# Patient Record
Sex: Female | Born: 1960
Health system: Southern US, Community
[De-identification: ages and names within clinical notes are randomized; demographics above are authoritative.]

## PROBLEM LIST (undated history)

## (undated) DIAGNOSIS — R1031 Right lower quadrant pain: Secondary | ICD-10-CM

## (undated) DIAGNOSIS — I10 Essential (primary) hypertension: Secondary | ICD-10-CM

---

## 1998-04-15 ENCOUNTER — Other Ambulatory Visit: Admission: RE | Admit: 1998-04-15 | Discharge: 1998-04-15 | Payer: Self-pay | Admitting: Obstetrics and Gynecology

## 2001-02-05 ENCOUNTER — Emergency Department (HOSPITAL_COMMUNITY): Admission: EM | Admit: 2001-02-05 | Discharge: 2001-02-05 | Payer: Self-pay | Admitting: Emergency Medicine

## 2001-07-04 ENCOUNTER — Other Ambulatory Visit: Admission: RE | Admit: 2001-07-04 | Discharge: 2001-07-04 | Payer: Self-pay | Admitting: Obstetrics and Gynecology

## 2002-12-24 ENCOUNTER — Emergency Department (HOSPITAL_COMMUNITY): Admission: EM | Admit: 2002-12-24 | Discharge: 2002-12-25 | Payer: Self-pay | Admitting: Emergency Medicine

## 2002-12-25 ENCOUNTER — Encounter: Payer: Self-pay | Admitting: Emergency Medicine

## 2002-12-25 ENCOUNTER — Other Ambulatory Visit: Admission: RE | Admit: 2002-12-25 | Discharge: 2002-12-25 | Payer: Self-pay | Admitting: Obstetrics and Gynecology

## 2015-03-22 ENCOUNTER — Ambulatory Visit (HOSPITAL_COMMUNITY)
Admission: RE | Admit: 2015-03-22 | Discharge: 2015-03-22 | Disposition: A | Discharge status: Home / Self Care | Payer: Self-pay | Source: Ambulatory Visit | Attending: General Practice | Admitting: General Practice

## 2015-03-22 ENCOUNTER — Other Ambulatory Visit (HOSPITAL_COMMUNITY): Payer: Self-pay | Admitting: General Practice

## 2015-03-22 DIAGNOSIS — R7612 Nonspecific reaction to cell mediated immunity measurement of gamma interferon antigen response without active tuberculosis: Secondary | ICD-10-CM

## 2016-01-17 ENCOUNTER — Inpatient Hospital Stay (HOSPITAL_COMMUNITY): Payer: 59

## 2016-01-17 ENCOUNTER — Encounter (HOSPITAL_COMMUNITY): Payer: Self-pay | Admitting: Emergency Medicine

## 2016-01-17 ENCOUNTER — Inpatient Hospital Stay (HOSPITAL_COMMUNITY)
Admission: EM | Admit: 2016-01-17 | Discharge: 2016-01-20 | DRG: 757 | Disposition: A | Discharge status: Home / Self Care | Payer: 59 | Attending: General Surgery | Admitting: General Surgery

## 2016-01-17 ENCOUNTER — Emergency Department (HOSPITAL_COMMUNITY): Payer: 59

## 2016-01-17 DIAGNOSIS — Y838 Other surgical procedures as the cause of abnormal reaction of the patient, or of later complication, without mention of misadventure at the time of the procedure: Secondary | ICD-10-CM | POA: Diagnosis not present

## 2016-01-17 DIAGNOSIS — I10 Essential (primary) hypertension: Secondary | ICD-10-CM | POA: Diagnosis present

## 2016-01-17 DIAGNOSIS — K352 Acute appendicitis with generalized peritonitis: Secondary | ICD-10-CM | POA: Diagnosis present

## 2016-01-17 DIAGNOSIS — F17210 Nicotine dependence, cigarettes, uncomplicated: Secondary | ICD-10-CM | POA: Diagnosis present

## 2016-01-17 DIAGNOSIS — T85838A Hemorrhage due to other internal prosthetic devices, implants and grafts, initial encounter: Secondary | ICD-10-CM | POA: Diagnosis not present

## 2016-01-17 DIAGNOSIS — K5900 Constipation, unspecified: Secondary | ICD-10-CM | POA: Diagnosis not present

## 2016-01-17 DIAGNOSIS — R1084 Generalized abdominal pain: Secondary | ICD-10-CM | POA: Diagnosis not present

## 2016-01-17 DIAGNOSIS — R933 Abnormal findings on diagnostic imaging of other parts of digestive tract: Secondary | ICD-10-CM | POA: Diagnosis not present

## 2016-01-17 DIAGNOSIS — R1031 Right lower quadrant pain: Secondary | ICD-10-CM | POA: Diagnosis not present

## 2016-01-17 DIAGNOSIS — N7093 Salpingitis and oophoritis, unspecified: Secondary | ICD-10-CM | POA: Diagnosis not present

## 2016-01-17 DIAGNOSIS — R1900 Intra-abdominal and pelvic swelling, mass and lump, unspecified site: Secondary | ICD-10-CM | POA: Diagnosis not present

## 2016-01-17 DIAGNOSIS — R1030 Lower abdominal pain, unspecified: Secondary | ICD-10-CM | POA: Diagnosis present

## 2016-01-17 DIAGNOSIS — R1903 Right lower quadrant abdominal swelling, mass and lump: Secondary | ICD-10-CM | POA: Diagnosis not present

## 2016-01-17 DIAGNOSIS — R188 Other ascites: Secondary | ICD-10-CM | POA: Diagnosis present

## 2016-01-17 DIAGNOSIS — N134 Hydroureter: Secondary | ICD-10-CM | POA: Diagnosis present

## 2016-01-17 DIAGNOSIS — K651 Peritoneal abscess: Secondary | ICD-10-CM | POA: Diagnosis not present

## 2016-01-17 HISTORY — DX: Essential (primary) hypertension: I10

## 2016-01-17 HISTORY — DX: Right lower quadrant pain: R10.31

## 2016-01-17 LAB — COMPREHENSIVE METABOLIC PANEL
ALBUMIN: 3.3 g/dL — AB (ref 3.5–5.0)
ALT: 56 U/L — ABNORMAL HIGH (ref 14–54)
ANION GAP: 13 (ref 5–15)
AST: 37 U/L (ref 15–41)
Alkaline Phosphatase: 93 U/L (ref 38–126)
BUN: 13 mg/dL (ref 6–20)
CO2: 24 mmol/L (ref 22–32)
Calcium: 9.7 mg/dL (ref 8.9–10.3)
Chloride: 103 mmol/L (ref 101–111)
Creatinine, Ser: 0.83 mg/dL (ref 0.44–1.00)
GFR calc non Af Amer: >60 mL/min (ref >60)
GLUCOSE: 85 mg/dL (ref 65–99)
POTASSIUM: 3.8 mmol/L (ref 3.5–5.1)
SODIUM: 140 mmol/L (ref 135–145)
TOTAL PROTEIN: 7.1 g/dL (ref 6.5–8.1)
Total Bilirubin: 0.4 mg/dL (ref 0.3–1.2)

## 2016-01-17 LAB — CBC
HEMATOCRIT: 33.5 % — AB (ref 36.0–46.0)
HEMOGLOBIN: 11 g/dL — AB (ref 12.0–15.0)
MCH: 31 pg (ref 26.0–34.0)
MCHC: 32.8 g/dL (ref 30.0–36.0)
MCV: 94.4 fL (ref 78.0–100.0)
Platelets: 393 10*3/uL (ref 150–400)
RBC: 3.55 MIL/uL — AB (ref 3.87–5.11)
RDW: 12.3 % (ref 11.5–15.5)
WBC: 20.1 10*3/uL — ABNORMAL HIGH (ref 4.0–10.5)

## 2016-01-17 LAB — URINE MICROSCOPIC-ADD ON

## 2016-01-17 LAB — URINALYSIS, ROUTINE W REFLEX MICROSCOPIC
BILIRUBIN URINE: NEGATIVE
Glucose, UA: NEGATIVE mg/dL
Ketones, ur: NEGATIVE mg/dL
Leukocytes, UA: NEGATIVE
NITRITE: NEGATIVE
PH: 5.5 (ref 5.0–8.0)
Protein, ur: NEGATIVE mg/dL
SPECIFIC GRAVITY, URINE: 1.009 (ref 1.005–1.030)

## 2016-01-17 LAB — APTT: aPTT: 38 seconds — ABNORMAL HIGH (ref 24–37)

## 2016-01-17 LAB — PROTIME-INR
INR: 1.21 (ref 0.00–1.49)
Prothrombin Time: 15.5 seconds — ABNORMAL HIGH (ref 11.6–15.2)

## 2016-01-17 LAB — PREGNANCY, URINE: Preg Test, Ur: NEGATIVE

## 2016-01-17 LAB — I-STAT BETA HCG BLOOD, ED (MC, WL, AP ONLY): HCG, QUANTITATIVE: 22.5 m[IU]/mL — AB (ref <5)

## 2016-01-17 LAB — LIPASE, BLOOD: Lipase: 25 U/L (ref 11–51)

## 2016-01-17 MED ORDER — FENTANYL CITRATE (PF) 100 MCG/2ML IJ SOLN
INTRAMUSCULAR | Status: AC | PRN
  Administered 2016-01-17 (×2): 50 ug via INTRAVENOUS

## 2016-01-17 MED ORDER — ONDANSETRON 4 MG PO TBDP: 4.0000 mg | ORAL_TABLET | Freq: Four times a day (QID) | ORAL | Status: DC | PRN

## 2016-01-17 MED ORDER — MIDAZOLAM HCL 2 MG/2ML IJ SOLN
INTRAMUSCULAR | Status: AC
  Filled 2016-01-17: qty 2

## 2016-01-17 MED ORDER — KETOROLAC TROMETHAMINE 30 MG/ML IJ SOLN
30.0000 mg | Freq: Once | INTRAMUSCULAR | Status: AC
  Administered 2016-01-17: 30 mg via INTRAVENOUS
  Filled 2016-01-17: qty 1

## 2016-01-17 MED ORDER — MIDAZOLAM HCL 2 MG/2ML IJ SOLN
INTRAMUSCULAR | Status: AC | PRN
  Administered 2016-01-17 (×2): 1 mg via INTRAVENOUS

## 2016-01-17 MED ORDER — IOPAMIDOL (ISOVUE-300) INJECTION 61%
INTRAVENOUS | Status: AC
  Filled 2016-01-17: qty 100

## 2016-01-17 MED ORDER — LIDOCAINE-EPINEPHRINE 1 %-1:100000 IJ SOLN
INTRAMUSCULAR | Status: AC
  Filled 2016-01-17: qty 1

## 2016-01-17 MED ORDER — ACETAMINOPHEN 325 MG PO TABS: 650.0000 mg | ORAL_TABLET | Freq: Four times a day (QID) | ORAL | Status: DC | PRN

## 2016-01-17 MED ORDER — IOHEXOL 300 MG/ML  SOLN
100.0000 mL | Freq: Once | INTRAMUSCULAR | Status: AC | PRN
  Administered 2016-01-17: 100 mL via INTRAVENOUS

## 2016-01-17 MED ORDER — FENTANYL CITRATE (PF) 100 MCG/2ML IJ SOLN
INTRAMUSCULAR | Status: AC
  Filled 2016-01-17: qty 2

## 2016-01-17 MED ORDER — DEXTROSE-NACL 5-0.9 % IV SOLN
INTRAVENOUS | Status: DC
  Administered 2016-01-17 – 2016-01-19 (×4): via INTRAVENOUS

## 2016-01-17 MED ORDER — METRONIDAZOLE IN NACL 5-0.79 MG/ML-% IV SOLN
500.0000 mg | Freq: Three times a day (TID) | INTRAVENOUS | Status: DC
  Administered 2016-01-17 – 2016-01-20 (×9): 500 mg via INTRAVENOUS
  Filled 2016-01-17 (×12): qty 100

## 2016-01-17 MED ORDER — MORPHINE SULFATE (PF) 2 MG/ML IV SOLN: 1.0000 mg | INTRAVENOUS | Status: DC | PRN

## 2016-01-17 MED ORDER — ONDANSETRON HCL 4 MG/2ML IJ SOLN: 4.0000 mg | Freq: Four times a day (QID) | INTRAMUSCULAR | Status: DC | PRN

## 2016-01-17 MED ORDER — DEXTROSE 5 % IV SOLN
2.0000 g | INTRAVENOUS | Status: DC
  Administered 2016-01-17 – 2016-01-19 (×3): 2 g via INTRAVENOUS
  Filled 2016-01-17 (×4): qty 2

## 2016-01-17 MED ORDER — ACETAMINOPHEN 650 MG RE SUPP: 650.0000 mg | Freq: Four times a day (QID) | RECTAL | Status: DC | PRN

## 2016-01-17 MED ORDER — SODIUM CHLORIDE 0.9 % IV BOLUS (SEPSIS)
1000.0000 mL | Freq: Once | INTRAVENOUS | Status: AC
  Administered 2016-01-17: 1000 mL via INTRAVENOUS

## 2016-01-17 NOTE — ED Provider Notes (Signed)
CSN: [id number]     Arrival date & time 01/17/16  0112 History   First MD Initiated Contact with Patient 01/17/16 [phone]     Chief Complaint  Patient presents with  . Constipation  . Abdominal Pain   HPI Cheryl Bryan is a 55 y.o. female PMH significant for hypertension presenting with constipation since Saturday. She states she had a thin, soft bowel movement yesterday but "it was like something was blocking it." She endorses mild,diffuse abdominal pain as a sharp pressure and states it was worse a few days ago, for which she was taking ibuprofen. She endorses nausea as well. She denies fevers, chills, emesis, urinary or vaginal complaints.   Past Medical History  Diagnosis Date  . Hypertension    Past Surgical History  Procedure Laterality Date  . Cesarean section     No family history on file. Social History  Substance Use Topics  . Smoking status: Current Every Day Smoker  . Smokeless tobacco: None  . Alcohol Use: Yes   OB History    No data available     Review of Systems  Ten systems are reviewed and are negative for acute change except as noted in the HPI  Allergies  Review of patient's allergies indicates no known allergies.  Home Medications   Prior to Admission medications   Not on File   BP 125/64 mmHg  Pulse 69  Temp(Src) 99.3 F (37.4 C) (Oral)  Resp 16  Ht  (1.676 m)  Wt 81.194 kg  BMI 28.91 kg/m2  SpO2 100% Physical Exam  Constitutional: She appears well-developed and well-nourished. No distress.  HENT:  Head: Normocephalic and atraumatic.  Mouth/Throat: Oropharynx is clear and moist. No oropharyngeal exudate.  Eyes: Conjunctivae are normal. Pupils are equal, round, and reactive to light. Right eye exhibits no discharge. Left eye exhibits no discharge. No scleral icterus.  Neck: No tracheal deviation present.  Cardiovascular: Normal rate, regular rhythm, normal heart sounds and intact distal pulses.  Exam reveals no gallop and no friction  rub.   No murmur heard. Pulmonary/Chest: Effort normal and breath sounds normal. No respiratory distress. She has no wheezes. She has no rales. She exhibits no tenderness.  Abdominal: Soft. Bowel sounds are normal. She exhibits no distension and no mass. There is tenderness. There is no rebound and no guarding.  Distractible, diffuse abdominal tenderness  Musculoskeletal: She exhibits no edema.  Lymphadenopathy:    She has no cervical adenopathy.  Neurological: She is alert. Coordination normal.  Skin: Skin is warm and dry. No rash noted. She is not diaphoretic. No erythema.  Psychiatric: She has a normal mood and affect. Her behavior is normal.  Nursing note and vitals reviewed.   ED Course  Procedures  Labs Review Labs Reviewed  COMPREHENSIVE METABOLIC PANEL - Abnormal; Notable for the following:    Albumin 3.3 (*)    ALT 56 (*)    All other components within normal limits  CBC - Abnormal; Notable for the following:    WBC 20.1 (*)    RBC 3.55 (*)    Hemoglobin 11.0 (*)    HCT 33.5 (*)    All other components within normal limits  URINALYSIS, ROUTINE W REFLEX MICROSCOPIC (NOT AT [facility]) - Abnormal; Notable for the following:    Hgb urine dipstick SMALL (*)    All other components within normal limits  URINE MICROSCOPIC-ADD ON - Abnormal; Notable for the following:    Squamous Epithelial / LPF 0-5 (*)  Bacteria, UA RARE (*)    All other components within normal limits  LIPASE, BLOOD   Imaging Review  Ct Abdomen Pelvis W Contrast  01/17/2016  CLINICAL DATA:  Constipation, leukocytosis, epigastric and right lower quadrant pain EXAM: CT ABDOMEN AND PELVIS WITH CONTRAST TECHNIQUE: Multidetector CT imaging of the abdomen and pelvis was performed using the standard protocol following bolus administration of intravenous contrast. CONTRAST:  100mL OMNIPAQUE IOHEXOL 300 MG/ML  SOLN COMPARISON:  None. FINDINGS: Lower chest:  Clear Hepatobiliary: Normal Pancreas: Normal Spleen: Normal  Adrenals/Urinary Tract: Adrenal glands are normal. Kidneys are normal. Mild dilatation right ureter with caliber transition where it passes along side the right pelvic mass which will be discussed in further detail below Stomach/Bowel: Nonobstructive bowel gas pattern. Stomach small bowel and large bowel are normal. The appendix arises off of the posterior inferior tip of the cecum and extends superiorly and medially. The appendix measures 11 mm in diameter. There is inflammatory change around the appendix, and its medial tip is obscured by its intimate association with the complex right pelvic mass immediately posterior and inferior to the appendix. The mass extends between the appendix in the right adnexa, and demonstrates both solid and cystic components. The cystic components show rim enhancement. This mass measures 51 x 57 mm in transverse, and 75 mm craniocaudal. There is a small volume of free fluid in the right adnexa, with a small loculated fluid component in the inferior posterior right adnexa seen on image number 63, measuring about 14 mm. Inflammatory change extends cranially from the mass into the retroperitoneum immediately inferior to the aortic bifurcation Vascular/Lymphatic: Minimal aortoiliac calcification. Small periaortic and aortocaval retroperitoneal lymph nodes. Reproductive: Left ovary is normal. Right ovary is obscured by the presumably inflammatory mass in the right adnexa. This mass also obscures the anterior and right borders of the uterus. Other: Small fat containing right inguinal hernia Musculoskeletal: No acute osseous abnormalities IMPRESSION: Complex multiloculated cystic/solid mass right lower quadrant. It is inseparable from both the appendix and the right adnexa, and is associated with inflammation as well as mild right hydroureter. Most likely etiology is appendicitis with associated periappendiceal abscesses. Electronically Signed   By: Esperanza Heiraymond  Rubner M.D.   On: 01/17/2016  10:37   I have personally reviewed and evaluated these images and lab results as part of my medical decision-making.  MDM   Final diagnoses:  None   Leukocytosis of 20.1, hemoglobin 11. Positive i-stat hCG at 22.5; negative urine. Due to leukocytosis, will CT scan for further evaluation. CT scan revealed complex multiloculated cystic/solid mass right lower quadrant. It is inseparable from both the appendix and the right adnexa, and is associated with inflammation as well as mild right hydroureter. Most likely etiology is appendicitis with associated periappendiceal abscesses. This case, based on my evaluation, as well as Dr. Garlan FillersKnott's, does not present as a periappendiceal abscess. Patient is afebrile, not tachycardic, not hypotensive, appears well other than mild discomfort. This is concerning for neoplasm. Discussed our concerns with patient, as well as other possible etiologies mentioned in CT scan.  Discussed case with surgery who will admit patient.   Melton KrebsSamantha Nicole Tavarus [place]ote[place], PA-C 01/20/16 1153  Lyndal Pulleyaniel Knott, MD 01/21/16 [phone]

## 2016-01-17 NOTE — Sedation Documentation (Signed)
Patient denies pain and is resting comfortably.  

## 2016-01-17 NOTE — Progress Notes (Signed)
Chief Complaint: Patient was seen in consultation today for pelvic abscess drainage at the request of Dr. Carman Ching  Referring Physician(s): Dr. Carman Ching  Supervising Physician: Simonne Come  History of Present Illness: Cheryl Bryan is a 55 y.o. female with about 5 days of abdominal pain and constipation. She presented to the ER and her workup finds a RLQ abscess possible related to perf appendicitis vs tubovarian abscess vs neoplasm Surgery has evaluated pt and requests IR to perc drain. Chart, PMHx, meds, labs, imaging, allergies reviewed. She has been NPO all day  Past Medical History  Diagnosis Date  . Hypertension     Past Surgical History  Procedure Laterality Date  . Cesarean section      Allergies: Review of patient's allergies indicates no known allergies.  Medications:  Current facility-administered medications:  .  acetaminophen (TYLENOL) tablet 650 mg, 650 mg, Oral, Q6H PRN **OR** acetaminophen (TYLENOL) suppository 650 mg, 650 mg, Rectal, Q6H PRN, Emina Riebock, NP .  cefTRIAXone (ROCEPHIN) 2 g in dextrose 5 % 50 mL IVPB, 2 g, Intravenous, Q24H, Last Rate: 100 mL/hr at 01/17/16 1148, 2 g at 01/17/16 1148 **AND** metroNIDAZOLE (FLAGYL) IVPB 500 mg, 500 mg, Intravenous, Q8H, Emina Riebock, NP .  dextrose 5 %-0.9 % sodium chloride infusion, , Intravenous, Continuous, Emina Riebock, NP .  iopamidol (ISOVUE-300) 61 % injection, , , ,  .  morphine 2 MG/ML injection 1-4 mg, 1-4 mg, Intravenous, Q2H PRN, Emina Riebock, NP .  ondansetron (ZOFRAN-ODT) disintegrating tablet 4 mg, 4 mg, Oral, Q6H PRN **OR** ondansetron (ZOFRAN) injection 4 mg, 4 mg, Intravenous, Q6H PRN, Ashok Norris, NP No current outpatient prescriptions on file.    History reviewed. No pertinent family history.  Social History   Social History  . Marital Status: Married    Spouse Name: N/A  . Number of Children: N/A  . Years of Education: N/A   Social History Main Topics  .  Smoking status: Current Every Day Smoker -- 1.00 packs/day  . Smokeless tobacco: None  . Alcohol Use: Yes     Comment: social  . Drug Use: No  . Sexual Activity: Not Asked   Other Topics Concern  . None   Social History Narrative  . None    ECOG Status: 1 - Symptomatic but completely ambulatory  Review of Systems: A 12 point ROS discussed and pertinent positives are indicated in the HPI above.  All other systems are negative.  Review of Systems  Vital Signs: BP 149/80 mmHg  Pulse 66  Temp(Src) 99.4 F (37.4 C) (Oral)  Resp 18  Ht  (1.676 m)  Wt 179 lb (81.194 kg)  BMI 28.91 kg/m2  SpO2 100%  Physical Exam  Constitutional: She is oriented to person, place, and time. She appears well-developed and well-nourished. No distress.  HENT:  Head: Normocephalic.  Mouth/Throat: Oropharynx is clear and moist.  Neck: Normal range of motion. No JVD present. No tracheal deviation present.  Cardiovascular: Normal rate, regular rhythm and normal heart sounds.   Pulmonary/Chest: Effort normal and breath sounds normal. No respiratory distress.  Abdominal: Soft. She exhibits no distension and no mass. There is tenderness.  Neurological: She is alert and oriented to person, place, and time.  Skin: She is not diaphoretic.  Psychiatric: She has a normal mood and affect. Judgment normal.    Mallampati Score:  MD Evaluation Airway: WNL Heart: WNL Abdomen: WNL Chest/ Lungs: WNL ASA  Classification: 2 Mallampati/Airway Score: One  Imaging:  Ct Abdomen Pelvis W Contrast  01/17/2016  CLINICAL DATA:  Constipation, leukocytosis, epigastric and right lower quadrant pain EXAM: CT ABDOMEN AND PELVIS WITH CONTRAST TECHNIQUE: Multidetector CT imaging of the abdomen and pelvis was performed using the standard protocol following bolus administration of intravenous contrast. CONTRAST:  100mL OMNIPAQUE IOHEXOL 300 MG/ML  SOLN COMPARISON:  None. FINDINGS: Lower chest:  Clear Hepatobiliary:  Normal Pancreas: Normal Spleen: Normal Adrenals/Urinary Tract: Adrenal glands are normal. Kidneys are normal. Mild dilatation right ureter with caliber transition where it passes along side the right pelvic mass which will be discussed in further detail below Stomach/Bowel: Nonobstructive bowel gas pattern. Stomach small bowel and large bowel are normal. The appendix arises off of the posterior inferior tip of the cecum and extends superiorly and medially. The appendix measures 11 mm in diameter. There is inflammatory change around the appendix, and its medial tip is obscured by its intimate association with the complex right pelvic mass immediately posterior and inferior to the appendix. The mass extends between the appendix in the right adnexa, and demonstrates both solid and cystic components. The cystic components show rim enhancement. This mass measures 51 x 57 mm in transverse, and 75 mm craniocaudal. There is a small volume of free fluid in the right adnexa, with a small loculated fluid component in the inferior posterior right adnexa seen on image number 63, measuring about 14 mm. Inflammatory change extends cranially from the mass into the retroperitoneum immediately inferior to the aortic bifurcation Vascular/Lymphatic: Minimal aortoiliac calcification. Small periaortic and aortocaval retroperitoneal lymph nodes. Reproductive: Left ovary is normal. Right ovary is obscured by the presumably inflammatory mass in the right adnexa. This mass also obscures the anterior and right borders of the uterus. Other: Small fat containing right inguinal hernia Musculoskeletal: No acute osseous abnormalities IMPRESSION: Complex multiloculated cystic/solid mass right lower quadrant. It is inseparable from both the appendix and the right adnexa, and is associated with inflammation as well as mild right hydroureter. Most likely etiology is appendicitis with associated periappendiceal abscesses. Electronically Signed   By:  Esperanza Heiraymond  Rubner M.D.   On: 01/17/2016 10:37    Labs:  CBC:  Recent Labs  01/17/16 0204  WBC 20.1*  HGB 11.0*  HCT 33.5*  PLT 393    COAGS:  Recent Labs  01/17/16 1133  INR 1.21  APTT 38*    BMP:  Recent Labs  01/17/16 0204  NA 140  K 3.8  CL 103  CO2 24  GLUCOSE 85  BUN 13  CALCIUM 9.7  CREATININE 0.83  GFRNONAA >60  GFRAA >60    LIVER FUNCTION TESTS:  Recent Labs  01/17/16 0204  BILITOT 0.4  AST 37  ALT 56*  ALKPHOS 93  PROT 7.1  ALBUMIN 3.3*    TUMOR MARKERS: No results for input(s): AFPTM, CEA, CA199, CHROMGRNA in the last 8760 hours.  Assessment and Plan: RLQ/pelvic abscess Plan for CT guided perc drain Labs reviewed Risks and Benefits discussed with the patient including bleeding, infection, damage to adjacent structures, bowel perforation/fistula connection, and sepsis. All of the patient's questions were answered, patient is agreeable to proceed. Consent signed and in chart.    Thank you for this interesting consult.  A copy of this report was sent to the requesting provider on this date.  Electronically Signed: Brayton ElBRUNING, Ellieana Dolecki 01/17/2016, 12:07 PM   I spent a total of 20 minutes in face to face in clinical consultation, greater than 50% of which was counseling/coordinating care for RLQ/pelvic abscess  drainage

## 2016-01-17 NOTE — ED Notes (Signed)
Pt. reports constipation onset last week with intermittent nausea , denies fever or chills , no emesis or diarrhea .

## 2016-01-17 NOTE — Care Management Note (Signed)
Case Management Note  Patient Details  Name: Cheryl Bryan MRN: [id number] Date of Birth: 1960/12/22  Subjective/Objective:                    Action/Plan:  Initial UR completed  Expected Discharge Date:                  Expected Discharge Plan:  Home/Self Care  In-House Referral:     Discharge planning Services     Post Acute Care Choice:    Choice offered to:     DME Arranged:    DME Agency:     HH Arranged:    HH Agency:     Status of Service:  In process, will continue to follow  Medicare Important Message Given:    Date Medicare IM Given:    Medicare IM give by:    Date Additional Medicare IM Given:    Additional Medicare Important Message give by:     If discussed at Long Length of Stay Meetings, dates discussed:    Additional Comments:  [name], [name] [name], RN 01/17/2016, 2:00 PM

## 2016-01-17 NOTE — ED Notes (Signed)
Attempted report x1. 

## 2016-01-17 NOTE — ED Notes (Signed)
All interventions performed by Student RN Michael Peters were with Primary RN Maekayla Giorgio.  

## 2016-01-17 NOTE — H&P (Signed)
Chief Complaint: abdominal pain and constipation  HPI: Cheryl Bryan is a healthy 55 year old female who presents with 6-7 day history of constipation and abdominal pain. Location right lower quadrant radiating to flank and to the periumbilical region.  Denies vomiting.  Endorses to nausea. Denies fever, chills or sweats.  Denies previous symptoms.  Characterized as cramping, sharp pain.  No aggravating factors, alleviated by NSAIDs. Marland Kitchen  Modifying factors include; ibuprofen and  Magnesium citrate.  Denies melena or hematochezia.  Denies menorrhagia.  She has never had a colonoscopy or GYN preventative care.  Denies family history of cancer.   ED work up reveals, WBC 20k, CT of abdomen and pelvis reveals Complex multiloculated cystic/solid mass right lower quadrant. It is inseparable from both the appendix and the right adnexa, and is associated with inflammation as well as mild right hydroureter. Most likely etiology is appendicitis with associated periappendiceal abscesses.   Past Medical History  Diagnosis Date  . Hypertension     Past Surgical History  Procedure Laterality Date  . Cesarean section      History reviewed. No pertinent family history. Social History:  reports that she has been smoking.  She does not have any smokeless tobacco history on file. She reports that she drinks alcohol. She reports that she does not use illicit drugs.  Allergies: No Known Allergies   (Not in a hospital admission)  Results for orders placed or performed during the hospital encounter of 01/17/16 (from the past 48 hour(s))  Urinalysis, Routine w reflex microscopic (not at [facility])     Status: Abnormal   Collection Time: 01/17/16  1:28 AM  Result Value Ref Range   Color, Urine YELLOW YELLOW   APPearance CLEAR CLEAR   Specific Gravity, Urine 1.009 1.005 - 1.030   pH 5.5 5.0 - 8.0   Glucose, UA NEGATIVE NEGATIVE mg/dL   Hgb urine dipstick SMALL (A) NEGATIVE   Bilirubin Urine NEGATIVE NEGATIVE    Ketones, ur NEGATIVE NEGATIVE mg/dL   Protein, ur NEGATIVE NEGATIVE mg/dL   Nitrite NEGATIVE NEGATIVE   Leukocytes, UA NEGATIVE NEGATIVE  Urine microscopic-add on     Status: Abnormal   Collection Time: 01/17/16  1:28 AM  Result Value Ref Range   Squamous Epithelial / LPF 0-5 (A) NONE SEEN   WBC, UA 0-5 0 - 5 WBC/hpf   RBC / HPF 0-5 0 - 5 RBC/hpf   Bacteria, UA RARE (A) NONE SEEN  Pregnancy, urine     Status: None   Collection Time: 01/17/16  1:28 AM  Result Value Ref Range   Preg Test, Ur NEGATIVE NEGATIVE    Comment:        THE SENSITIVITY OF THIS METHODOLOGY IS >20 mIU/mL.   Lipase, blood     Status: None   Collection Time: 01/17/16  2:04 AM  Result Value Ref Range   Lipase 25 11 - 51 U/L  Comprehensive metabolic panel     Status: Abnormal   Collection Time: 01/17/16  2:04 AM  Result Value Ref Range   Sodium 140 135 - 145 mmol/L   Potassium 3.8 3.5 - 5.1 mmol/L   Chloride 103 101 - 111 mmol/L   CO2 24 22 - 32 mmol/L   Glucose, Bld 85 65 - 99 mg/dL   BUN 13 6 - 20 mg/dL   Creatinine, Ser 0.83 0.44 - 1.00 mg/dL   Calcium 9.7 8.9 - 10.3 mg/dL   Total Protein 7.1 6.5 - 8.1 g/dL   Albumin  3.3 (L) 3.5 - 5.0 g/dL   AST 37 15 - 41 U/L   ALT 56 (H) 14 - 54 U/L   Alkaline Phosphatase 93 38 - 126 U/L   Total Bilirubin 0.4 0.3 - 1.2 mg/dL   GFR calc non Af Amer >60 >60 mL/min   GFR calc Af Amer >60 >60 mL/min    Comment: (NOTE) The eGFR has been calculated using the CKD EPI equation. This calculation has not been validated in all clinical situations. eGFR's persistently <60 mL/min signify possible Chronic Kidney Disease.    Anion gap 13 5 - 15  CBC     Status: Abnormal   Collection Time: 01/17/16  2:04 AM  Result Value Ref Range   WBC 20.1 (H) 4.0 - 10.5 K/uL   RBC 3.55 (L) 3.87 - 5.11 MIL/uL   Hemoglobin 11.0 (L) 12.0 - 15.0 g/dL   HCT 33.5 (L) 36.0 - 46.0 %   MCV 94.4 78.0 - 100.0 fL   MCH 31.0 26.0 - 34.0 pg   MCHC 32.8 30.0 - 36.0 g/dL   RDW 12.3 11.5 - 15.5 %    Platelets 393 150 - 400 K/uL  I-Stat Beta hCG blood, ED ([facility], [facility], [facility] only)     Status: Abnormal   Collection Time: 01/17/16  6:34 AM  Result Value Ref Range   I-stat hCG, quantitative 22.5 (H) <5 mIU/mL   Comment 3            Comment:   GEST. AGE      CONC.  (mIU/mL)   <=1 WEEK        5 - 50     2 WEEKS       50 - 500     3 WEEKS       100 - 10,000     4 WEEKS     1,000 - 30,000        FEMALE AND NON-PREGNANT FEMALE:     LESS THAN 5 mIU/mL    Ct Abdomen Pelvis W Contrast  01/17/2016  CLINICAL DATA:  Constipation, leukocytosis, epigastric and right lower quadrant pain EXAM: CT ABDOMEN AND PELVIS WITH CONTRAST TECHNIQUE: Multidetector CT imaging of the abdomen and pelvis was performed using the standard protocol following bolus administration of intravenous contrast. CONTRAST:  171m OMNIPAQUE IOHEXOL 300 MG/ML  SOLN COMPARISON:  None. FINDINGS: Lower chest:  Clear Hepatobiliary: Normal Pancreas: Normal Spleen: Normal Adrenals/Urinary Tract: Adrenal glands are normal. Kidneys are normal. Mild dilatation right ureter with caliber transition where it passes along side the right pelvic mass which will be discussed in further detail below Stomach/Bowel: Nonobstructive bowel gas pattern. Stomach small bowel and large bowel are normal. The appendix arises off of the posterior inferior tip of the cecum and extends superiorly and medially. The appendix measures 11 mm in diameter. There is inflammatory change around the appendix, and its medial tip is obscured by its intimate association with the complex right pelvic mass immediately posterior and inferior to the appendix. The mass extends between the appendix in the right adnexa, and demonstrates both solid and cystic components. The cystic components show rim enhancement. This mass measures 51 x 57 mm in transverse, and 75 mm craniocaudal. There is a small volume of free fluid in the right adnexa, with a small loculated fluid component in the inferior posterior  right adnexa seen on image number 63, measuring about 14 mm. Inflammatory change extends cranially from the mass into the retroperitoneum immediately inferior to the  aortic bifurcation Vascular/Lymphatic: Minimal aortoiliac calcification. Small periaortic and aortocaval retroperitoneal lymph nodes. Reproductive: Left ovary is normal. Right ovary is obscured by the presumably inflammatory mass in the right adnexa. This mass also obscures the anterior and right borders of the uterus. Other: Small fat containing right inguinal hernia Musculoskeletal: No acute osseous abnormalities IMPRESSION: Complex multiloculated cystic/solid mass right lower quadrant. It is inseparable from both the appendix and the right adnexa, and is associated with inflammation as well as mild right hydroureter. Most likely etiology is appendicitis with associated periappendiceal abscesses. Electronically Signed   By: Skipper Cliche M.D.   On: 01/17/2016 10:37    Review of Systems  Constitutional: Negative for fever, chills, weight loss, malaise/fatigue and diaphoresis.  Respiratory: Negative for cough, hemoptysis, sputum production, shortness of breath and wheezing.   Cardiovascular: Negative for chest pain, palpitations, orthopnea, claudication, leg swelling and PND.  Gastrointestinal: Positive for nausea, abdominal pain and constipation. Negative for vomiting, diarrhea, blood in stool and melena.  Genitourinary: Negative for dysuria, urgency, frequency, hematuria and flank pain.  Musculoskeletal: Negative for myalgias, back pain, joint pain, falls and neck pain.  Neurological: Negative for dizziness, tingling, tremors, sensory change, speech change, focal weakness, seizures, loss of consciousness and weakness.  Psychiatric/Behavioral: Negative for depression, suicidal ideas, hallucinations, memory loss and substance abuse. The patient is not nervous/anxious and does not have insomnia.     Blood pressure 129/75, pulse 69,  temperature 99.3 F (37.4 C), temperature source Oral, resp. rate 20, height 5' 6"  (1.676 m), weight 81.194 kg (179 lb), SpO2 100 %. Physical Exam  Constitutional: She is oriented to person, place, and time. She appears well-developed and well-nourished. No distress.  Cardiovascular: Normal rate, regular rhythm, normal heart sounds and intact distal pulses.  Exam reveals no gallop and no friction rub.   No murmur heard. Respiratory: Effort normal and breath sounds normal. No respiratory distress. She has no wheezes. She has no rales. She exhibits no tenderness.  GI: Soft. Bowel sounds are normal. She exhibits no distension and no mass. There is tenderness. There is no rebound and no guarding.  Musculoskeletal: Normal range of motion. She exhibits no edema or tenderness.  Neurological: She is alert and oriented to person, place, and time.  Skin: Skin is warm and dry. No rash noted. She is not diaphoretic. No erythema. No pallor.  Psychiatric: She has a normal mood and affect. Her behavior is normal. Thought content normal.     Assessment/Plan Multi loculated fluid collection, perforated appendicitis versus a tubo-ovarian abscess versus tumor Will ask IR to evaluate for drain placement, obtain cultures, admit for IV antibiotics, agree with a transvaginal US.  FEN-NPO, IVF VTE prophylaxis-SCDs, lovenox after IR procedure Dispo-to floor   Aundray Cartlidge, NP 01/17/2016, 11:17 AM

## 2016-01-17 NOTE — ED Notes (Signed)
Pt transported to US

## 2016-01-17 NOTE — Sedation Documentation (Signed)
Patient is resting comfortably. Pt grimacing, additional medication given as discussed with Dr. Grace IsaacWatts

## 2016-01-17 NOTE — ED Notes (Signed)
Delay of CT due to positive HCG.

## 2016-01-17 NOTE — Sedation Documentation (Addendum)
Patient denies pain and is resting comfortably.  

## 2016-01-17 NOTE — Procedures (Signed)
Technically successful CT guided aspiration of approximately 5 cc of purulent material from the right hemipelvis. Unsuccessful attempted placement of a drainage catheter due to size of collection, location and thickness of cavity wall.  All aspirated samples sent to the laboratory for analysis.   EBL: Minimal Procedure complicated by development of a small amount of hemorrhage within the right hemipelvis following attempted drain placement.   Katherina RightJay Brynn Reznik, MD Pager #: [phone]

## 2016-01-18 LAB — BASIC METABOLIC PANEL
Anion gap: 8 (ref 5–15)
BUN: 6 mg/dL (ref 6–20)
CALCIUM: 9 mg/dL (ref 8.9–10.3)
CHLORIDE: 111 mmol/L (ref 101–111)
CO2: 24 mmol/L (ref 22–32)
CREATININE: 0.72 mg/dL (ref 0.44–1.00)
Glucose, Bld: 113 mg/dL — ABNORMAL HIGH (ref 65–99)
Potassium: 4.8 mmol/L (ref 3.5–5.1)
SODIUM: 143 mmol/L (ref 135–145)

## 2016-01-18 LAB — CBC
HCT: 30 % — ABNORMAL LOW (ref 36.0–46.0)
Hemoglobin: 9.7 g/dL — ABNORMAL LOW (ref 12.0–15.0)
MCH: 31 pg (ref 26.0–34.0)
MCHC: 32.3 g/dL (ref 30.0–36.0)
MCV: 95.8 fL (ref 78.0–100.0)
PLATELETS: 355 10*3/uL (ref 150–400)
RBC: 3.13 MIL/uL — AB (ref 3.87–5.11)
RDW: 12.5 % (ref 11.5–15.5)
WBC: 13.1 10*3/uL — AB (ref 4.0–10.5)

## 2016-01-18 MED ORDER — HYDROCODONE-ACETAMINOPHEN 5-325 MG PO TABS
1.0000 | ORAL_TABLET | ORAL | Status: DC | PRN
  Administered 2016-01-18 – 2016-01-20 (×3): 2 via ORAL
  Filled 2016-01-18 (×3): qty 2

## 2016-01-18 MED ORDER — MORPHINE SULFATE (PF) 2 MG/ML IV SOLN: 1.0000 mg | INTRAVENOUS | Status: DC | PRN

## 2016-01-18 MED ORDER — ENOXAPARIN SODIUM 40 MG/0.4ML ~~LOC~~ SOLN: 40.0000 mg | SUBCUTANEOUS | Status: DC

## 2016-01-18 NOTE — Progress Notes (Signed)
Referring Physician(s): Dr. Carman Ching  Supervising Physician: Simonne Come  Chief Complaint:  Pelvic abscess S/P CT guided drainage/aspiration 01/17/2016 by Dr. Grace Isaac   Subjective:  [name] states she feels better. She says "I still feel like something is going on, but for the most part I feel better".  Allergies: Review of patient's allergies indicates no known allergies.  Medications: Prior to Admission medications   Not on File     Vital Signs: BP 118/60 mmHg  Pulse 60  Temp(Src) 99 F (37.2 C) (Oral)  Resp 18  Ht 5\' 6"  (1.676 m)  Wt 179 lb (81.194 kg)  BMI 28.91 kg/m2  SpO2 100%  Physical Exam  Awake and Alert, Sitting on side of the bed Heart RRR Lungs clear RLQ stick site with a small amount of bleeding.  Abdomen less tender  Imaging: US Transvaginal Non-ob  01/17/2016  CLINICAL DATA:  Right lower quadrant pain.  Mass on CT EXAM: TRANSABDOMINAL AND TRANSVAGINAL ULTRASOUND OF PELVIS TECHNIQUE: Study was performed transabdominally to optimize pelvic field of view evaluation and transvaginally to optimize internal visceral architecture evaluation. COMPARISON:  CT abdomen and pelvis January 17, 2016 FINDINGS: Uterus Measurements: 6.9 x 5.2 x 5.9 cm. Uterus is retroverted. The echotexture of the uterus is diffusely inhomogeneous. There is a 3.1 x 1.8 x 2.6 cm hypoechoic mass in the leftward uterine fundus region. There is a 1.5 x 0.8 x 1.4 cm hypoechoic mass in the left body of the uterus. There is a 1.8 x 1.1 x 1.7 cm hypoechoic lesion in the body of the uterus on the right. These areas are felt to represent leiomyomatous changes. Endometrium Thickness: 4 mm.  No focal abnormality visualized. Right ovary Measurements: Normal right ovary is not apparent. There is a complex right adnexal mass measuring 5.7 x 5.3 x 7.5 cm. Left ovary Measurements: 3.0 x 1.3 x 1.2 cm. There is a probable follicle measuring 1.0 x 0.9 x 0.8 cm within the left ovary. Left ovary otherwise  normal in appearance. Other findings No abnormal free fluid. IMPRESSION: Complex right adnexal mass which corresponds to the complex lesion seen on CT. Differential considerations for this lesion include periappendiceal abscess/appendicitis; pelvic inflammatory disease with tubo-ovarian abscess ; atypical neoplasm. This lesion has an inflammatory appearance. Surgical consultation warranted. Uterus is retroverted with leiomyomatous change. There are several discrete masses within the uterus consistent with leiomyomas as well as an overall inhomogeneous echotexture. No appreciable free pelvic fluid. Electronically Signed   By: Bretta Bang III M.D.   On: 01/17/2016 13:14   US Pelvis Complete  01/17/2016  CLINICAL DATA:  Right lower quadrant pain.  Mass on CT EXAM: TRANSABDOMINAL AND TRANSVAGINAL ULTRASOUND OF PELVIS TECHNIQUE: Study was performed transabdominally to optimize pelvic field of view evaluation and transvaginally to optimize internal visceral architecture evaluation. COMPARISON:  CT abdomen and pelvis January 17, 2016 FINDINGS: Uterus Measurements: 6.9 x 5.2 x 5.9 cm. Uterus is retroverted. The echotexture of the uterus is diffusely inhomogeneous. There is a 3.1 x 1.8 x 2.6 cm hypoechoic mass in the leftward uterine fundus region. There is a 1.5 x 0.8 x 1.4 cm hypoechoic mass in the left body of the uterus. There is a 1.8 x 1.1 x 1.7 cm hypoechoic lesion in the body of the uterus on the right. These areas are felt to represent leiomyomatous changes. Endometrium Thickness: 4 mm.  No focal abnormality visualized. Right ovary Measurements: Normal right ovary is not apparent. There is a complex right adnexal  mass measuring 5.7 x 5.3 x 7.5 cm. Left ovary Measurements: 3.0 x 1.3 x 1.2 cm. There is a probable follicle measuring 1.0 x 0.9 x 0.8 cm within the left ovary. Left ovary otherwise normal in appearance. Other findings No abnormal free fluid. IMPRESSION: Complex right adnexal mass which corresponds  to the complex lesion seen on CT. Differential considerations for this lesion include periappendiceal abscess/appendicitis; pelvic inflammatory disease with tubo-ovarian abscess ; atypical neoplasm. This lesion has an inflammatory appearance. Surgical consultation warranted. Uterus is retroverted with leiomyomatous change. There are several discrete masses within the uterus consistent with leiomyomas as well as an overall inhomogeneous echotexture. No appreciable free pelvic fluid. Electronically Signed   By: Bretta BangWilliam  Woodruff III M.D.   On: 01/17/2016 13:14   Ct Abdomen Pelvis W Contrast  01/17/2016  CLINICAL DATA:  Constipation, leukocytosis, epigastric and right lower quadrant pain EXAM: CT ABDOMEN AND PELVIS WITH CONTRAST TECHNIQUE: Multidetector CT imaging of the abdomen and pelvis was performed using the standard protocol following bolus administration of intravenous contrast. CONTRAST:  100mL OMNIPAQUE IOHEXOL 300 MG/ML  SOLN COMPARISON:  None. FINDINGS: Lower chest:  Clear Hepatobiliary: Normal Pancreas: Normal Spleen: Normal Adrenals/Urinary Tract: Adrenal glands are normal. Kidneys are normal. Mild dilatation right ureter with caliber transition where it passes along side the right pelvic mass which will be discussed in further detail below Stomach/Bowel: Nonobstructive bowel gas pattern. Stomach small bowel and large bowel are normal. The appendix arises off of the posterior inferior tip of the cecum and extends superiorly and medially. The appendix measures 11 mm in diameter. There is inflammatory change around the appendix, and its medial tip is obscured by its intimate association with the complex right pelvic mass immediately posterior and inferior to the appendix. The mass extends between the appendix in the right adnexa, and demonstrates both solid and cystic components. The cystic components show rim enhancement. This mass measures 51 x 57 mm in transverse, and 75 mm craniocaudal. There is a  small volume of free fluid in the right adnexa, with a small loculated fluid component in the inferior posterior right adnexa seen on image number 63, measuring about 14 mm. Inflammatory change extends cranially from the mass into the retroperitoneum immediately inferior to the aortic bifurcation Vascular/Lymphatic: Minimal aortoiliac calcification. Small periaortic and aortocaval retroperitoneal lymph nodes. Reproductive: Left ovary is normal. Right ovary is obscured by the presumably inflammatory mass in the right adnexa. This mass also obscures the anterior and right borders of the uterus. Other: Small fat containing right inguinal hernia Musculoskeletal: No acute osseous abnormalities IMPRESSION: Complex multiloculated cystic/solid mass right lower quadrant. It is inseparable from both the appendix and the right adnexa, and is associated with inflammation as well as mild right hydroureter. Most likely etiology is appendicitis with associated periappendiceal abscesses. Electronically Signed   By: Esperanza Heiraymond  Rubner M.D.   On: 01/17/2016 10:37   Ct Aspiration  01/17/2016  INDICATION: Concern for either ruptured appendicitis with periappendiceal abscess versus tubo-ovarian abscess. Please perform CT-guided aspiration and/or percutaneous drainage catheter placement. EXAM: CT-GUIDED ASPIRATION OF COMPLEX FLUID COLLECTION WITHIN THE RIGHT HEMIPELVIS COMPARISON:  CT abdomen pelvis - 01/17/2016 MEDICATIONS: The patient is currently admitted to the hospital and receiving intravenous antibiotics. The antibiotics were administered within an appropriate time frame prior to the initiation of the procedure. ANESTHESIA/SEDATION: Moderate (conscious) sedation was employed during this procedure. A total of Versed 2 mg and Fentanyl 100 mcg was administered intravenously. Moderate Sedation Time: 40 minutes. The patient's level of  consciousness and vital signs were monitored continuously by radiology nursing throughout the procedure  under my direct supervision. CONTRAST:  None COMPLICATIONS: SIR Level A - No therapy, no consequence. Procedure complicated by development of a small amount of hemorrhage within the right hemipelvis following attempted percutaneous drainage catheter placement. PROCEDURE: Informed written consent was obtained from the patient after a discussion of the risks, benefits and alternatives to treatment. The patient was placed supine on the CT gantry and a pre procedural CT was performed re-demonstrating the known abscess/fluid collection within the right lower pelvis/adnexa with dominant component measuring approximately 0.2 x 4.2 cm (image 40, series 2). The procedure was planned. A timeout was performed prior to the initiation of the procedure. The skin overlying the right anterior lateral pelvis was prepped and draped in the usual sterile fashion. The overlying soft tissues were anesthetized with 1% lidocaine with epinephrine. Appropriate trajectory was planned with the use of a 22 gauge spinal needle. An 18 gauge trocar needle was advanced into the abscess/fluid collection and approximately 5 cc of purulent, foul smelling fluid was aspirated. A short Amplatz super stiff wire was coiled within the collection. Appropriate positioning was confirmed with a limited CT scan. The tract was serially dilated, however attempted placement of an 8 French percutaneous drainage catheter proved difficult with the percutaneous drainage catheter coiling to the lateral aspect of pelvic fluid collection. Attempts were made to react cystic fluid collection however these ultimately proved unsuccessful. Postprocedural scanning demonstrated development of a small hemorrhage worsened right lower pelvis and as such the decision was made to terminate attempted percutaneous drainage catheter placement. A dressing was placed. The patient tolerated the procedure well without immediate post procedural complication. IMPRESSION: 1. Successful CT  guided aspiration of approximately 5 cc of purulent, foul smelling fluid from the right pelvic/adnexal fluid collection. Samples were sent to the laboratory as requested by the ordering clinical team. 2. Attempted though ultimately unsuccessful placement of a percutaneous drain secondary to a combination of the fluid collection's size, location and thickened wall. Procedure complicated by development of a small amount of hemorrhage within the right hemipelvis following attempted percutaneous drainage catheter placement. Critical Value/emergent results were called by telephone at the time of interpretation on 01/17/2016 at 4:45 pm to Dr. Ashok Norris , who verbally acknowledged these results. Electronically Signed   By: Simonne Come M.D.   On: 01/17/2016 16:45    Labs:  CBC:  Recent Labs  01/17/16 0204 01/18/16 0430  WBC 20.1* 13.1*  HGB 11.0* 9.7*  HCT 33.5* 30.0*  PLT 393 355    COAGS:  Recent Labs  01/17/16 1133  INR 1.21  APTT 38*    BMP:  Recent Labs  01/17/16 0204 01/18/16 0430  NA 140 143  K 3.8 4.8  CL 103 111  CO2 24 24  GLUCOSE 85 113*  BUN 13 6  CALCIUM 9.7 9.0  CREATININE 0.83 0.72  GFRNONAA >60 >60  GFRAA >60 >60    LIVER FUNCTION TESTS:  Recent Labs  01/17/16 0204  BILITOT 0.4  AST 37  ALT 56*  ALKPHOS 93  PROT 7.1  ALBUMIN 3.3*    Assessment and Plan:  Pelvic abscess S/P CT guided aspiration by Dr. Grace Isaac 01/17/2016  Will order clean dressing to be applied to RLQ Continue Abx  Electronically Signed: Gwynneth Macleod PA-C 01/18/2016, 10:38 AM   I spent a total of 15 Minutes at the the patient's bedside AND on the patient's hospital floor or unit,  greater than 50% of which was counseling/coordinating care for f/u after aspiration.

## 2016-01-18 NOTE — Progress Notes (Signed)
[facility] Progress Note     Subjective: Pt doing well, no N/V, pain improved after aspiration (was not able to get drain).  Ambulating well.  Had a good BM and flatus.  Urinating well.  Thirsty/hungry.  Objective: Vital signs in last 24 hours: Temp:  [98 F (36.7 C)-99.4 F (37.4 C)] 99 F (37.2 C) (03/25 0502) Pulse Rate:  [60-88] 60 (03/25 0502) Resp:  [16-20] 18 (03/25 0502) BP: (107-152)/(46-80) 118/60 mmHg (03/25 0502) SpO2:  [98 %-100 %] 100 % (03/25 0502) Weight:  [81.194 kg (179 lb)] 81.194 kg (179 lb) (03/24 1321)    Intake/Output from previous day: 03/24 0701 - 03/25 0700 In: 2996.3 [P.O.:840; I.V.:2156.3] Out: 1250 [Urine:1250] Intake/Output this shift:    PE: Gen:  Alert, NAD, pleasant Card:  RRR, no M/G/R heard Pulm:  CTA, no W/R/R Abd: Soft, mild distension, mild tenderness in the RLQ, +BS, no HSM  Lab Results:   Recent Labs  01/17/16 0204 01/18/16 0430  WBC 20.1* 13.1*  HGB 11.0* 9.7*  HCT 33.5* 30.0*  PLT 393 355   BMET  Recent Labs  01/17/16 0204 01/18/16 0430  NA 140 143  K 3.8 4.8  CL 103 111  CO2 24 24  GLUCOSE 85 113*  BUN 13 6  CREATININE 0.83 0.72  CALCIUM 9.7 9.0   PT/INR  Recent Labs  01/17/16 1133  LABPROT 15.5*  INR 1.21   CMP     Component Value Date/Time   NA 143 01/18/2016 0430   K 4.8 01/18/2016 0430   CL 111 01/18/2016 0430   CO2 24 01/18/2016 0430   GLUCOSE 113* 01/18/2016 0430   BUN 6 01/18/2016 0430   CREATININE 0.72 01/18/2016 0430   CALCIUM 9.0 01/18/2016 0430   PROT 7.1 01/17/2016 0204   ALBUMIN 3.3* 01/17/2016 0204   AST 37 01/17/2016 0204   ALT 56* 01/17/2016 0204   ALKPHOS 93 01/17/2016 0204   BILITOT 0.4 01/17/2016 0204   GFRNONAA >60 01/18/2016 0430   GFRAA >60 01/18/2016 0430   Lipase     Component Value Date/Time   LIPASE 25 01/17/2016 0204       Studies/Results: US Transvaginal Non-ob  01/17/2016  CLINICAL DATA:  Right lower quadrant pain.  Mass on CT EXAM:  TRANSABDOMINAL AND TRANSVAGINAL ULTRASOUND OF PELVIS TECHNIQUE: Study was performed transabdominally to optimize pelvic field of view evaluation and transvaginally to optimize internal visceral architecture evaluation. COMPARISON:  CT abdomen and pelvis January 17, 2016 FINDINGS: Uterus Measurements: 6.9 x 5.2 x 5.9 cm. Uterus is retroverted. The echotexture of the uterus is diffusely inhomogeneous. There is a 3.1 x 1.8 x 2.6 cm hypoechoic mass in the leftward uterine fundus region. There is a 1.5 x 0.8 x 1.4 cm hypoechoic mass in the left body of the uterus. There is a 1.8 x 1.1 x 1.7 cm hypoechoic lesion in the body of the uterus on the right. These areas are felt to represent leiomyomatous changes. Endometrium Thickness: 4 mm.  No focal abnormality visualized. Right ovary Measurements: Normal right ovary is not apparent. There is a complex right adnexal mass measuring 5.7 x 5.3 x 7.5 cm. Left ovary Measurements: 3.0 x 1.3 x 1.2 cm. There is a probable follicle measuring 1.0 x 0.9 x 0.8 cm within the left ovary. Left ovary otherwise normal in appearance. Other findings No abnormal free fluid. IMPRESSION: Complex right adnexal mass which corresponds to the complex lesion seen on CT. Differential considerations for this lesion include periappendiceal abscess/appendicitis;  pelvic inflammatory disease with tubo-ovarian abscess ; atypical neoplasm. This lesion has an inflammatory appearance. Surgical consultation warranted. Uterus is retroverted with leiomyomatous change. There are several discrete masses within the uterus consistent with leiomyomas as well as an overall inhomogeneous echotexture. No appreciable free pelvic fluid. Electronically Signed   By: Bretta Bang III M.D.   On: 01/17/2016 13:14   US Pelvis Complete  01/17/2016  CLINICAL DATA:  Right lower quadrant pain.  Mass on CT EXAM: TRANSABDOMINAL AND TRANSVAGINAL ULTRASOUND OF PELVIS TECHNIQUE: Study was performed transabdominally to optimize pelvic  field of view evaluation and transvaginally to optimize internal visceral architecture evaluation. COMPARISON:  CT abdomen and pelvis January 17, 2016 FINDINGS: Uterus Measurements: 6.9 x 5.2 x 5.9 cm. Uterus is retroverted. The echotexture of the uterus is diffusely inhomogeneous. There is a 3.1 x 1.8 x 2.6 cm hypoechoic mass in the leftward uterine fundus region. There is a 1.5 x 0.8 x 1.4 cm hypoechoic mass in the left body of the uterus. There is a 1.8 x 1.1 x 1.7 cm hypoechoic lesion in the body of the uterus on the right. These areas are felt to represent leiomyomatous changes. Endometrium Thickness: 4 mm.  No focal abnormality visualized. Right ovary Measurements: Normal right ovary is not apparent. There is a complex right adnexal mass measuring 5.7 x 5.3 x 7.5 cm. Left ovary Measurements: 3.0 x 1.3 x 1.2 cm. There is a probable follicle measuring 1.0 x 0.9 x 0.8 cm within the left ovary. Left ovary otherwise normal in appearance. Other findings No abnormal free fluid. IMPRESSION: Complex right adnexal mass which corresponds to the complex lesion seen on CT. Differential considerations for this lesion include periappendiceal abscess/appendicitis; pelvic inflammatory disease with tubo-ovarian abscess ; atypical neoplasm. This lesion has an inflammatory appearance. Surgical consultation warranted. Uterus is retroverted with leiomyomatous change. There are several discrete masses within the uterus consistent with leiomyomas as well as an overall inhomogeneous echotexture. No appreciable free pelvic fluid. Electronically Signed   By: Bretta Bang III M.D.   On: 01/17/2016 13:14   Ct Abdomen Pelvis W Contrast  01/17/2016  CLINICAL DATA:  Constipation, leukocytosis, epigastric and right lower quadrant pain EXAM: CT ABDOMEN AND PELVIS WITH CONTRAST TECHNIQUE: Multidetector CT imaging of the abdomen and pelvis was performed using the standard protocol following bolus administration of intravenous contrast.  CONTRAST:  OMNIPAQUE IOHEXOL 300 MG/ML  SOLN COMPARISON:  None. FINDINGS: Lower chest:  Clear Hepatobiliary: Normal Pancreas: Normal Spleen: Normal Adrenals/Urinary Tract: Adrenal glands are normal. Kidneys are normal. Mild dilatation right ureter with caliber transition where it passes along side the right pelvic mass which will be discussed in further detail below Stomach/Bowel: Nonobstructive bowel gas pattern. Stomach small bowel and large bowel are normal. The appendix arises off of the posterior inferior tip of the cecum and extends superiorly and medially. The appendix measures 11 mm in diameter. There is inflammatory change around the appendix, and its medial tip is obscured by its intimate association with the complex right pelvic mass immediately posterior and inferior to the appendix. The mass extends between the appendix in the right adnexa, and demonstrates both solid and cystic components. The cystic components show rim enhancement. This mass measures 51 x 57 mm in transverse, and 75 mm craniocaudal. There is a small volume of free fluid in the right adnexa, with a small loculated fluid component in the inferior posterior right adnexa seen on image number 63, measuring about 14 mm. Inflammatory change extends cranially  from the mass into the retroperitoneum immediately inferior to the aortic bifurcation Vascular/Lymphatic: Minimal aortoiliac calcification. Small periaortic and aortocaval retroperitoneal lymph nodes. Reproductive: Left ovary is normal. Right ovary is obscured by the presumably inflammatory mass in the right adnexa. This mass also obscures the anterior and right borders of the uterus. Other: Small fat containing right inguinal hernia Musculoskeletal: No acute osseous abnormalities IMPRESSION: Complex multiloculated cystic/solid mass right lower quadrant. It is inseparable from both the appendix and the right adnexa, and is associated with inflammation as well as mild right  hydroureter. Most likely etiology is appendicitis with associated periappendiceal abscesses. Electronically Signed   By: Esperanza Heiraymond  Rubner M.D.   On: 01/17/2016 10:37   Ct Aspiration  01/17/2016  INDICATION: Concern for either ruptured appendicitis with periappendiceal abscess versus tubo-ovarian abscess. Please perform CT-guided aspiration and/or percutaneous drainage catheter placement. EXAM: CT-GUIDED ASPIRATION OF COMPLEX FLUID COLLECTION WITHIN THE RIGHT HEMIPELVIS COMPARISON:  CT abdomen pelvis - 01/17/2016 MEDICATIONS: The patient is currently admitted to the hospital and receiving intravenous antibiotics. The antibiotics were administered within an appropriate time frame prior to the initiation of the procedure. ANESTHESIA/SEDATION: Moderate (conscious) sedation was employed during this procedure. A total of Versed 2 mg and Fentanyl 100 mcg was administered intravenously. Moderate Sedation Time: 40 minutes. The patient's level of consciousness and vital signs were monitored continuously by radiology nursing throughout the procedure under my direct supervision. CONTRAST:  None COMPLICATIONS: SIR Level A - No therapy, no consequence. Procedure complicated by development of a small amount of hemorrhage within the right hemipelvis following attempted percutaneous drainage catheter placement. PROCEDURE: Informed written consent was obtained from the patient after a discussion of the risks, benefits and alternatives to treatment. The patient was placed supine on the CT gantry and a pre procedural CT was performed re-demonstrating the known abscess/fluid collection within the right lower pelvis/adnexa with dominant component measuring approximately 0.2 x 4.2 cm (image 40, series 2). The procedure was planned. A timeout was performed prior to the initiation of the procedure. The skin overlying the right anterior lateral pelvis was prepped and draped in the usual sterile fashion. The overlying soft tissues were  anesthetized with 1% lidocaine with epinephrine. Appropriate trajectory was planned with the use of a 22 gauge spinal needle. An 18 gauge trocar needle was advanced into the abscess/fluid collection and approximately 5 cc of purulent, foul smelling fluid was aspirated. A short Amplatz super stiff wire was coiled within the collection. Appropriate positioning was confirmed with a limited CT scan. The tract was serially dilated, however attempted placement of an 8 French percutaneous drainage catheter proved difficult with the percutaneous drainage catheter coiling to the lateral aspect of pelvic fluid collection. Attempts were made to react cystic fluid collection however these ultimately proved unsuccessful. Postprocedural scanning demonstrated development of a small hemorrhage worsened right lower pelvis and as such the decision was made to terminate attempted percutaneous drainage catheter placement. A dressing was placed. The patient tolerated the procedure well without immediate post procedural complication. IMPRESSION: 1. Successful CT guided aspiration of approximately 5 cc of purulent, foul smelling fluid from the right pelvic/adnexal fluid collection. Samples were sent to the laboratory as requested by the ordering clinical team. 2. Attempted though ultimately unsuccessful placement of a percutaneous drain secondary to a combination of the fluid collection's size, location and thickened wall. Procedure complicated by development of a small amount of hemorrhage within the right hemipelvis following attempted percutaneous drainage catheter placement. Critical Value/emergent results were called by  telephone at the time of interpretation on 01/17/2016 at 4:45 pm to Dr. Ashok Norris , who verbally acknowledged these results. Electronically Signed   By: Simonne Come M.D.   On: 01/17/2016 16:45    Anti-infectives: Anti-infectives    Start     Dose/Rate Route Frequency Ordered Stop   01/17/16 1145  cefTRIAXone  (ROCEPHIN) 2 g in dextrose 5 % 50 mL IVPB     2 g 100 mL/hr over 30 Minutes Intravenous Every 24 hours 01/17/16 1130     01/17/16 1145  metroNIDAZOLE (FLAGYL) IVPB 500 mg     500 mg 100 mL/hr over 60 Minutes Intravenous Every 8 hours 01/17/16 1130         Assessment/Plan Multi loculated fluid collection, perforated appendicitis vs a tubo-ovarian abscess vs tumor -S/p IR aspiration, but unsuccessful drain placement(01/17/16), cultures pending, IV antibiotics Day #2 -Transvaginal/pelvic US showed complex right adnexal lesion (abscess), CT speculates whether its from periappendiceal abscess, PID with TOA, or atypical neoplasm.  May need diagnostic lap in the future, but hopefully not this admission.   FEN-NPO, IVF ID-WBC down to 13.1 VTE prophylaxis-SCDs, Hold lovenox - had some blood loss yesterday during IR procedure, Hgb was 9.7 today, resume lovenox tomorrow if Hgb stable, ambulate Dispo-to floor    LOS: 1 day    Nonie Hoyer 01/18/2016, 8:20 AM Pager: [phone]  (7am - 4:30pm M-F; 7am - 11:30am Sa/Su)

## 2016-01-19 MED ORDER — ENOXAPARIN SODIUM 40 MG/0.4ML ~~LOC~~ SOLN
40.0000 mg | SUBCUTANEOUS | Status: DC
  Administered 2016-01-19: 40 mg via SUBCUTANEOUS
  Filled 2016-01-19: qty 0.4

## 2016-01-19 NOTE — Progress Notes (Signed)
Subjective: Feels much better, had bm and passed flatus  Objective: Vital signs in last 24 hours: Temp:  [98.3 F (36.8 C)-98.5 F (36.9 C)] 98.5 F (36.9 C) (03/26 0513) Pulse Rate:  [53-74] 74 (03/26 0513) Resp:  [16-18] 17 (03/26 0513) BP: (120-137)/(73-81) 128/81 mmHg (03/26 0513) SpO2:  [100 %] 100 % (03/26 0513) Last BM Date: 01/17/16  Intake/Output from previous day: 03/25 0701 - 03/26 0700 In: 2102 [P.O.:1300; I.V.:802] Out: 2600 [Urine:2600] Intake/Output this shift:    GI: soft minimal tenderness rlq bs present  Lab Results:   Recent Labs  01/17/16 0204 01/18/16 0430  WBC 20.1* 13.1*  HGB 11.0* 9.7*  HCT 33.5* 30.0*  PLT 393 355   BMET  Recent Labs  01/17/16 0204 01/18/16 0430  NA 140 143  K 3.8 4.8  CL 103 111  CO2 24 24  GLUCOSE 85 113*  BUN 13 6  CREATININE 0.83 0.72  CALCIUM 9.7 9.0   PT/INR  Recent Labs  01/17/16 1133  LABPROT 15.5*  INR 1.21   ABG No results for input(s): PHART, HCO3 in the last 72 hours.  Invalid input(s): PCO2, PO2  Studies/Results: US Transvaginal Non-ob  01/17/2016  CLINICAL DATA:  Right lower quadrant pain.  Mass on CT EXAM: TRANSABDOMINAL AND TRANSVAGINAL ULTRASOUND OF PELVIS TECHNIQUE: Study was performed transabdominally to optimize pelvic field of view evaluation and transvaginally to optimize internal visceral architecture evaluation. COMPARISON:  CT abdomen and pelvis January 17, 2016 FINDINGS: Uterus Measurements: 6.9 x 5.2 x 5.9 cm. Uterus is retroverted. The echotexture of the uterus is diffusely inhomogeneous. There is a 3.1 x 1.8 x 2.6 cm hypoechoic mass in the leftward uterine fundus region. There is a 1.5 x 0.8 x 1.4 cm hypoechoic mass in the left body of the uterus. There is a 1.8 x 1.1 x 1.7 cm hypoechoic lesion in the body of the uterus on the right. These areas are felt to represent leiomyomatous changes. Endometrium Thickness: 4 mm.  No focal abnormality visualized. Right ovary Measurements:  Normal right ovary is not apparent. There is a complex right adnexal mass measuring 5.7 x 5.3 x 7.5 cm. Left ovary Measurements: 3.0 x 1.3 x 1.2 cm. There is a probable follicle measuring 1.0 x 0.9 x 0.8 cm within the left ovary. Left ovary otherwise normal in appearance. Other findings No abnormal free fluid. IMPRESSION: Complex right adnexal mass which corresponds to the complex lesion seen on CT. Differential considerations for this lesion include periappendiceal abscess/appendicitis; pelvic inflammatory disease with tubo-ovarian abscess ; atypical neoplasm. This lesion has an inflammatory appearance. Surgical consultation warranted. Uterus is retroverted with leiomyomatous change. There are several discrete masses within the uterus consistent with leiomyomas as well as an overall inhomogeneous echotexture. No appreciable free pelvic fluid. Electronically Signed   By: Bretta Bang III M.D.   On: 01/17/2016 13:14   US Pelvis Complete  01/17/2016  CLINICAL DATA:  Right lower quadrant pain.  Mass on CT EXAM: TRANSABDOMINAL AND TRANSVAGINAL ULTRASOUND OF PELVIS TECHNIQUE: Study was performed transabdominally to optimize pelvic field of view evaluation and transvaginally to optimize internal visceral architecture evaluation. COMPARISON:  CT abdomen and pelvis January 17, 2016 FINDINGS: Uterus Measurements: 6.9 x 5.2 x 5.9 cm. Uterus is retroverted. The echotexture of the uterus is diffusely inhomogeneous. There is a 3.1 x 1.8 x 2.6 cm hypoechoic mass in the leftward uterine fundus region. There is a 1.5 x 0.8 x 1.4 cm hypoechoic mass in the left body of the uterus.  There is a 1.8 x 1.1 x 1.7 cm hypoechoic lesion in the body of the uterus on the right. These areas are felt to represent leiomyomatous changes. Endometrium Thickness: 4 mm.  No focal abnormality visualized. Right ovary Measurements: Normal right ovary is not apparent. There is a complex right adnexal mass measuring 5.7 x 5.3 x 7.5 cm. Left ovary  Measurements: 3.0 x 1.3 x 1.2 cm. There is a probable follicle measuring 1.0 x 0.9 x 0.8 cm within the left ovary. Left ovary otherwise normal in appearance. Other findings No abnormal free fluid. IMPRESSION: Complex right adnexal mass which corresponds to the complex lesion seen on CT. Differential considerations for this lesion include periappendiceal abscess/appendicitis; pelvic inflammatory disease with tubo-ovarian abscess ; atypical neoplasm. This lesion has an inflammatory appearance. Surgical consultation warranted. Uterus is retroverted with leiomyomatous change. There are several discrete masses within the uterus consistent with leiomyomas as well as an overall inhomogeneous echotexture. No appreciable free pelvic fluid. Electronically Signed   By: Bretta Bang III M.D.   On: 01/17/2016 13:14   Ct Aspiration  01/17/2016  INDICATION: Concern for either ruptured appendicitis with periappendiceal abscess versus tubo-ovarian abscess. Please perform CT-guided aspiration and/or percutaneous drainage catheter placement. EXAM: CT-GUIDED ASPIRATION OF COMPLEX FLUID COLLECTION WITHIN THE RIGHT HEMIPELVIS COMPARISON:  CT abdomen pelvis - 01/17/2016 MEDICATIONS: The patient is currently admitted to the hospital and receiving intravenous antibiotics. The antibiotics were administered within an appropriate time frame prior to the initiation of the procedure. ANESTHESIA/SEDATION: Moderate (conscious) sedation was employed during this procedure. A total of Versed 2 mg and Fentanyl 100 mcg was administered intravenously. Moderate Sedation Time: 40 minutes. The patient's level of consciousness and vital signs were monitored continuously by radiology nursing throughout the procedure under my direct supervision. CONTRAST:  None COMPLICATIONS: SIR Level A - No therapy, no consequence. Procedure complicated by development of a small amount of hemorrhage within the right hemipelvis following attempted percutaneous  drainage catheter placement. PROCEDURE: Informed written consent was obtained from the patient after a discussion of the risks, benefits and alternatives to treatment. The patient was placed supine on the CT gantry and a pre procedural CT was performed re-demonstrating the known abscess/fluid collection within the right lower pelvis/adnexa with dominant component measuring approximately 0.2 x 4.2 cm (image 40, series 2). The procedure was planned. A timeout was performed prior to the initiation of the procedure. The skin overlying the right anterior lateral pelvis was prepped and draped in the usual sterile fashion. The overlying soft tissues were anesthetized with 1% lidocaine with epinephrine. Appropriate trajectory was planned with the use of a 22 gauge spinal needle. An 18 gauge trocar needle was advanced into the abscess/fluid collection and approximately 5 cc of purulent, foul smelling fluid was aspirated. A short Amplatz super stiff wire was coiled within the collection. Appropriate positioning was confirmed with a limited CT scan. The tract was serially dilated, however attempted placement of an 8 French percutaneous drainage catheter proved difficult with the percutaneous drainage catheter coiling to the lateral aspect of pelvic fluid collection. Attempts were made to react cystic fluid collection however these ultimately proved unsuccessful. Postprocedural scanning demonstrated development of a small hemorrhage worsened right lower pelvis and as such the decision was made to terminate attempted percutaneous drainage catheter placement. A dressing was placed. The patient tolerated the procedure well without immediate post procedural complication. IMPRESSION: 1. Successful CT guided aspiration of approximately 5 cc of purulent, foul smelling fluid from the right pelvic/adnexal fluid  collection. Samples were sent to the laboratory as requested by the ordering clinical team. 2. Attempted though ultimately  unsuccessful placement of a percutaneous drain secondary to a combination of the fluid collection's size, location and thickened wall. Procedure complicated by development of a small amount of hemorrhage within the right hemipelvis following attempted percutaneous drainage catheter placement. Critical Value/emergent results were called by telephone at the time of interpretation on 01/17/2016 at 4:45 pm to Dr. Ashok NorrisEMINA RIEBOCK , who verbally acknowledged these results. Electronically Signed   By: Simonne ComeJohn  Watts M.D.   On: 01/17/2016 16:45    Anti-infectives: Anti-infectives    Start     Dose/Rate Route Frequency Ordered Stop   01/17/16 1145  cefTRIAXone (ROCEPHIN) 2 g in dextrose 5 % 50 mL IVPB     2 g 100 mL/hr over 30 Minutes Intravenous Every 24 hours 01/17/16 1130     01/17/16 1145  metroNIDAZOLE (FLAGYL) IVPB 500 mg     500 mg 100 mL/hr over 60 Minutes Intravenous Every 8 hours 01/17/16 1130        Assessment/Plan: Multi loculated fluid collection, perforated appendicitis vs a tubo-ovarian abscess  -S/p IR aspiration, but unsuccessful drain placement(01/17/16), cultures pending, IV antibiotics Day #3 -Transvaginal/pelvic US showed complex right adnexal lesion (abscess), CT speculates whether its from periappendiceal abscess, PID with TOA, or atypical neoplasm. May need diagnostic lap in the future, but hopefully not this admission or just repeat imaging  FEN-decrease iv fluids, advance diet as tolerated ID-recheck wbc in am VTE prophylaxis-SCDs, lovenox Dispo-possibly home tomorrow on oral abx  [facility] AnnexWAKEFIELD,Quenna Doepke 01/19/2016

## 2016-01-20 ENCOUNTER — Encounter (HOSPITAL_COMMUNITY): Payer: Self-pay | Admitting: General Surgery

## 2016-01-20 ENCOUNTER — Encounter: Payer: Self-pay | Admitting: General Surgery

## 2016-01-20 ENCOUNTER — Other Ambulatory Visit: Payer: Self-pay | Admitting: Radiology

## 2016-01-20 DIAGNOSIS — R1031 Right lower quadrant pain: Secondary | ICD-10-CM

## 2016-01-20 HISTORY — DX: Right lower quadrant pain: R10.31

## 2016-01-20 LAB — CULTURE, ROUTINE-ABSCESS: Gram Stain: NONE SEEN

## 2016-01-20 LAB — CBC
HCT: 28.9 % — ABNORMAL LOW (ref 36.0–46.0)
Hemoglobin: 9.4 g/dL — ABNORMAL LOW (ref 12.0–15.0)
MCH: 30.7 pg (ref 26.0–34.0)
MCHC: 32.5 g/dL (ref 30.0–36.0)
MCV: 94.4 fL (ref 78.0–100.0)
PLATELETS: 388 10*3/uL (ref 150–400)
RBC: 3.06 MIL/uL — AB (ref 3.87–5.11)
RDW: 12.2 % (ref 11.5–15.5)
WBC: 7.8 10*3/uL (ref 4.0–10.5)

## 2016-01-20 MED ORDER — CIPROFLOXACIN HCL 500 MG PO TABS: 500.0000 mg | ORAL_TABLET | Freq: Two times a day (BID) | ORAL | Status: AC

## 2016-01-20 MED ORDER — METRONIDAZOLE 500 MG PO TABS: 500.0000 mg | ORAL_TABLET | Freq: Three times a day (TID) | ORAL | Status: DC

## 2016-01-20 MED ORDER — HYDROCODONE-ACETAMINOPHEN 5-325 MG PO TABS: 1.0000 | ORAL_TABLET | ORAL | Status: AC | PRN

## 2016-01-20 MED ORDER — SACCHAROMYCES BOULARDII 250 MG PO CAPS: 250.0000 mg | ORAL_CAPSULE | Freq: Two times a day (BID) | ORAL | Status: DC

## 2016-01-20 MED ORDER — ACETAMINOPHEN 325 MG PO TABS: 650.0000 mg | ORAL_TABLET | Freq: Four times a day (QID) | ORAL | Status: AC | PRN

## 2016-01-20 MED ORDER — METRONIDAZOLE 500 MG PO TABS: 500.0000 mg | ORAL_TABLET | Freq: Three times a day (TID) | ORAL | Status: AC

## 2016-01-20 MED ORDER — SACCHAROMYCES BOULARDII 250 MG PO CAPS: ORAL_CAPSULE | ORAL | Status: AC

## 2016-01-20 MED ORDER — CIPROFLOXACIN HCL 500 MG PO TABS: 500.0000 mg | ORAL_TABLET | Freq: Two times a day (BID) | ORAL | Status: DC

## 2016-01-20 MED FILL — metroNIDAZOLE 500 MG TABS: 500 | 7 days supply | Qty: 21 | Fill #0

## 2016-01-20 MED FILL — HYDROCODON-APAP 5-325: 5-325 | 3 days supply | Qty: 40 | Fill #0

## 2016-01-20 MED FILL — CIPROFLOXACIN HCL 500 MG TA: 500 | 7 days supply | Qty: 14 | Fill #0

## 2016-01-20 NOTE — Discharge Summary (Signed)
Physician Discharge Summary  Patient ID: Cheryl Bryan MRN: [id number] DOB/AGE: 01/01/1961 55 y.o.  Admit date: 01/17/2016 Discharge date: 01/20/2016  Admission Diagnoses:  Abdominal pain/constipaton Perforated tubo-ovarian abscess or perforated appendix IR aspiration and drainage of abscess, no drain placed 01/17/16 Hx of hypertension  Discharge Diagnoses:  Same    Principal Problem:   Abdominal pain, right lower quadrant Active Problems:   Intra-abdominal fluid collection   PROCEDURES: None   Hospital Course:  Cheryl Bryan is a healthy 55 year old female who presents with 6-7 day history of constipation and abdominal pain. Location right lower quadrant radiating to flank and to the periumbilical region. Denies vomiting. Endorses to nausea. Denies fever, chills or sweats. Denies previous symptoms. Characterized as cramping, sharp pain. No aggravating factors, alleviated by NSAIDs. Cheryl Bryan. Modifying factors include; ibuprofen and Magnesium citrate. Denies melena or hematochezia. Denies menorrhagia. She has never had a colonoscopy or GYN preventative care. Denies family history of cancer.   ED work up reveals, WBC 20k, CT of abdomen and pelvis reveals Complex multiloculated cystic/solid mass right lower quadrant. It is inseparable from both the appendix and the right adnexa, and is associated with inflammation as well as mild right hydroureter. Most likely etiology is appendicitis with associated periappendiceal abscesses. Pt was admitted and placed on IV antibiotics.  It was Dr. Eliberto IvoryBlackman's opinion that this was either a tubo ovarian abscess or from her appendix.  IR saw her and tried to drain site, but could not do more than aspirate the site.  Culture grew out multiple organisms.  She has improved and is up to a regular diet.  Pain is much better. WBC is back to normal.  It was Dr. Dixon BoosWyatt's opinion she could go home on 01/20/16, on 1 more week of Cipro/Flagyl.  She will follow  up in 2 weeks with Dr. Magnus IvanBlackman, and OB GYN as noted below.     CBC Latest Ref Rng 01/19/2016 01/18/2016 01/17/2016  WBC 4.0 - 10.5 K/uL 7.8 13.1(H) 20.1(H)  Hemoglobin 12.0 - 15.0 g/dL 4.0(J9.4(L) 8.1(X9.7(L) 11.0(L)  Hematocrit 36.0 - 46.0 % 28.9(L) 30.0(L) 33.5(L)  Platelets 150 - 400 K/uL 388 355 393   CMP Latest Ref Rng 01/18/2016 01/17/2016  Glucose 65 - 99 mg/dL 914(N113(H) 85  BUN 6 - 20 mg/dL 6 13  Creatinine 8.290.44 - 1.00 mg/dL 5.620.72 1.300.83  Sodium 865135 - 145 mmol/L 143 140  Potassium 3.5 - 5.1 mmol/L 4.8 3.8  Chloride 101 - 111 mmol/L 111 103  CO2 22 - 32 mmol/L 24 24  Calcium 8.9 - 10.3 mg/dL 9.0 9.7  Total Protein 6.5 - 8.1 g/dL - 7.1  Total Bilirubin 0.3 - 1.2 mg/dL - 0.4  Alkaline Phos 38 - 126 U/L - 93  AST 15 - 41 U/L - 37  ALT 14 - 54 U/L - 56(H)   Follow up listed below.  Condition on d/c:  Improved    Disposition: 01-Home or Self Care     Medication List    TAKE these medications        acetaminophen 325 MG tablet  Commonly known as:  TYLENOL  Take 2 tablets (650 mg total) by mouth every 6 (six) hours as needed for mild pain (or temp > 100).     ciprofloxacin 500 MG tablet  Commonly known as:  CIPRO  Take 1 tablet (500 mg total) by mouth 2 (two) times daily.     HYDROcodone-acetaminophen 5-325 MG tablet  Commonly known as:  NORCO/VICODIN  Take  1-2 tablets by mouth every 4 (four) hours as needed for moderate pain or severe pain.     metroNIDAZOLE 500 MG tablet  Commonly known as:  FLAGYL  Take 1 tablet (500 mg total) by mouth every 8 (eight) hours.     saccharomyces boulardii 250 MG capsule  Commonly known as:  FLORASTOR  You can buy this over the counter at any pharmacy.  Follow directions on package.       Follow-up Information    Follow up with [facility].   Specialty:  Obstetrics and Gynecology   Why:  They should be calling you with an appointment.  If you do not hear by tomorrow call them.     Contact information:   [address] [place] [address] [phone]       Schedule an appointment as soon as possible for a visit with Cheryl Rubenstein, MD.   Specialty:  General Surgery   Why:  Call for an appointment when you get home.   Contact information:   [address] [place] [address] [phone]       Signed: Sherrie George 01/20/2016, 2:56 PM

## 2016-01-20 NOTE — Progress Notes (Signed)
Cheryl Bryan to be D/C'd  per MD order. Discussed with the patient and all questions fully answered.  VSS, Skin clean, dry and intact without evidence of skin break down, no evidence of skin tears noted.  IV catheter discontinued intact. Site without signs and symptoms of complications. Dressing and pressure applied.  An After Visit Summary was printed and given to the patient. Patient received prescription.  D/c education completed with patient/family including follow up instructions, medication list, d/c activities limitations if indicated, with other d/c instructions as indicated by MD - patient able to verbalize understanding, all questions fully answered.   Patient instructed to return to ED, call 911, or call MD for any changes in condition.   Patient to be escorted via WC, and D/C home via private auto.

## 2016-01-20 NOTE — Progress Notes (Signed)
[facility] Progress Note    Subjective: Patient is doing much much better.  Currently without pain.  Objective: Vital signs in last 24 hours: Temp:  [98.5 F (36.9 C)-98.9 F (37.2 C)] 98.5 F (36.9 C) (03/27 0440) Pulse Rate:  [56-78] 75 (03/27 0440) Resp:  [18-19] 19 (03/27 0440) BP: (128-147)/(60-80) 128/71 mmHg (03/27 0440) SpO2:  [100 %] 100 % (03/27 0440) Last BM Date: 01/19/16  Intake/Output from previous day: 03/26 0701 - 03/27 0700 In: 1290 [P.O.:840; I.V.:450] Out: 1300 [Urine:1300] Intake/Output this shift:    General: No ac  Lungs: Clear to auscultation  Abd: Soft, good bowel sounds.  Extremities: No clinical signs or symptoms of DVT  Neuro: Intact  Lab Results:  @LABLAST2 (wbc:2,hgb:2,hct:2,plt:2) BMET ) Recent Labs  01/18/16 0430  NA 143  K 4.8  CL 111  CO2 24  GLUCOSE 113*  BUN 6  CREATININE 0.72  CALCIUM 9.0   PT/INR  Recent Labs  01/17/16 1133  LABPROT 15.5*  INR 1.21   ABG No results for input(s): PHART, HCO3 in the last 72 hours.  Invalid input(s): PCO2, PO2  Studies/Results: No results found.  Anti-infectives: Anti-infectives    Start     Dose/Rate Route Frequency Ordered Stop   01/17/16 1145  cefTRIAXone (ROCEPHIN) 2 g in dextrose 5 % 50 mL IVPB     2 g 100 mL/hr over 30 Minutes Intravenous Every 24 hours 01/17/16 1130     01/17/16 1145  metroNIDAZOLE (FLAGYL) IVPB 500 mg     500 mg 100 mL/hr over 60 Minutes Intravenous Every 8 hours 01/17/16 1130        Assessment/Plan: s/p  Discharge Send home on PO antibiotics  Will get follow up appoint with GYn of her choice--I have spoken with [facility] who will halp make arrangements for DYn follow up in the near future.  LOS: 3 days   Marta LamasJames O. Gae BonWyatt, III, MD, FACS [phone]--pager [phone]--office [facility] 01/20/2016

## 2016-01-20 NOTE — Discharge Instructions (Signed)
Appendicitis Appendicitis is when the appendix is swollen (inflamed). The inflammation can lead to developing a hole (perforation) and a collection of pus (abscess). CAUSES  There is not always an obvious cause of appendicitis. Sometimes it is caused by an obstruction in the appendix. The obstruction can be caused by:  A small, hard, pea-sized ball of stool (fecalith).  Enlarged lymph glands in the appendix. SYMPTOMS   Pain around your belly button (navel) that moves toward your lower right belly (abdomen). The pain can become more severe and sharp as time passes.  Tenderness in the lower right abdomen. Pain gets worse if you cough or make a sudden movement.  Feeling sick to your stomach (nauseous).  Throwing up (vomiting).  Loss of appetite.  Fever.  Constipation.  Diarrhea.  Generally not feeling well. DIAGNOSIS   Physical exam.  Blood tests.  Urine test.  X-rays or a CT scan may confirm the diagnosis. TREATMENT  Once the diagnosis of appendicitis is made, the most common treatment is to remove the appendix as soon as possible. This procedure is called appendectomy. In an open appendectomy, a cut (incision) is made in the lower right abdomen and the appendix is removed. In a laparoscopic appendectomy, usually 3 small incisions are made. Long, thin instruments and a camera tube are used to remove the appendix. Most patients go home in 24 to 48 hours after appendectomy. In some situations, the appendix may have already perforated and an abscess may have formed. The abscess may have a "wall" around it as seen on a CT scan. In this case, a drain may be placed into the abscess to remove fluid, and you may be treated with antibiotic medicines that kill germs. The medicine is given through a tube in your vein (IV). Once the abscess has resolved, it may or may not be necessary to have an appendectomy. You may need to stay in the hospital longer than 48 hours.  If we take out your  appendix it would be some weeks from now.   This information is not intended to replace advice given to you by your health care provider. Make sure you discuss any questions you have with your health care provider.   Document Released: 10/12/2005 Document Revised: 04/12/2012 Document Reviewed: 02/27/2015 Elsevier Interactive Patient Education 2016 [employer]  Abscess An abscess is an infected area that contains a collection of pus and debris.It can occur in almost any part of the body. An abscess is also known as a furuncle or boil. CAUSES  An abscess occurs when tissue gets infected. This can occur from blockage of oil or sweat glands, infection of hair follicles, or a minor injury to the skin. As the body tries to fight the infection, pus collects in the area and creates pressure under the skin. This pressure causes pain. People with weakened immune systems have difficulty fighting infections and get certain abscesses more often.  SYMPTOMS Usually an abscess develops on the skin and becomes a painful mass that is red, warm, and tender. If the abscess forms under the skin, you may feel a moveable soft area under the skin. Some abscesses break open (rupture) on their own, but most will continue to get worse without care. The infection can spread deeper into the body and eventually into the bloodstream, causing you to feel ill.  DIAGNOSIS  Your caregiver will take your medical history and perform a physical exam. A sample of fluid may also be taken from the abscess to determine  what is causing your infection. TREATMENT  Your caregiver may prescribe antibiotic medicines to fight the infection. However, taking antibiotics alone usually does not cure an abscess. Your caregiver may need to make a small cut (incision) in the abscess to drain the pus. In some cases, gauze is packed into the abscess to reduce pain and to continue draining the area. HOME CARE INSTRUCTIONS   Only take over-the-counter  or prescription medicines for pain, discomfort, or fever as directed by your caregiver.  If you were prescribed antibiotics, take them as directed. Finish them even if you start to feel better.  If gauze is used, follow your caregiver's directions for changing the gauze.  To avoid spreading the infection:  Keep your draining abscess covered with a bandage.  Wash your hands well.  Do not share personal care items, towels, or whirlpools with others.  Avoid skin contact with others.  Keep your skin and clothes clean around the abscess.  Keep all follow-up appointments as directed by your caregiver. SEEK MEDICAL CARE IF:   You have increased pain, swelling, redness, fluid drainage, or bleeding.  You have muscle aches, chills, or a general ill feeling.  You have a fever. MAKE SURE YOU:   Understand these instructions.  Will watch your condition.  Will get help right away if you are not doing well or get worse.   This information is not intended to replace advice given to you by your health care provider. Make sure you discuss any questions you have with your health care provider.   Document Released: 07/22/2005 Document Revised: 04/12/2012 Document Reviewed: 12/25/2011 Elsevier Interactive Patient Education [employer]  We are not sure if you have an abscess of the appendix or an ovarian-falliopian type abscess.

## 2016-01-31 DIAGNOSIS — Z124 Encounter for screening for malignant neoplasm of cervix: Secondary | ICD-10-CM | POA: Diagnosis not present

## 2016-01-31 DIAGNOSIS — Z1231 Encounter for screening mammogram for malignant neoplasm of breast: Secondary | ICD-10-CM | POA: Diagnosis not present

## 2016-01-31 DIAGNOSIS — Z01419 Encounter for gynecological examination (general) (routine) without abnormal findings: Secondary | ICD-10-CM | POA: Diagnosis not present

## 2016-01-31 DIAGNOSIS — F17200 Nicotine dependence, unspecified, uncomplicated: Secondary | ICD-10-CM | POA: Diagnosis not present

## 2016-01-31 DIAGNOSIS — Z6828 Body mass index (BMI) 28.0-28.9, adult: Secondary | ICD-10-CM | POA: Diagnosis not present

## 2016-02-17 ENCOUNTER — Other Ambulatory Visit: Payer: Self-pay | Admitting: Surgery

## 2016-02-17 DIAGNOSIS — IMO0002 Reserved for concepts with insufficient information to code with codable children: Secondary | ICD-10-CM

## 2016-02-17 DIAGNOSIS — K651 Peritoneal abscess: Secondary | ICD-10-CM | POA: Diagnosis not present

## 2016-02-17 DIAGNOSIS — N949 Unspecified condition associated with female genital organs and menstrual cycle: Secondary | ICD-10-CM | POA: Diagnosis not present

## 2016-02-18 MED FILL — HYDROCODON-APAP 5-325: 5-325 | 5 days supply | Qty: 30 | Fill #0

## 2016-02-24 ENCOUNTER — Ambulatory Visit
Admission: RE | Admit: 2016-02-24 | Discharge: 2016-02-24 | Disposition: A | Discharge status: Home / Self Care | Payer: 59 | Source: Ambulatory Visit | Attending: Surgery | Admitting: Surgery

## 2016-02-24 DIAGNOSIS — K651 Peritoneal abscess: Secondary | ICD-10-CM | POA: Diagnosis not present

## 2016-02-24 DIAGNOSIS — IMO0002 Reserved for concepts with insufficient information to code with codable children: Secondary | ICD-10-CM

## 2016-02-24 MED ORDER — IOPAMIDOL (ISOVUE-300) INJECTION 61%
100.0000 mL | Freq: Once | INTRAVENOUS | Status: AC | PRN
  Administered 2016-02-24: 100 mL via INTRAVENOUS

## 2017-05-13 IMAGING — CT CT ABD-PELV W/ CM
2 of 8 series · 15 of 46 positions shown, 17 images · IV contrast (Omni 300)
Comparison: None.

CLINICAL DATA: Constipation, leukocytosis, epigastric and right
lower quadrant pain

EXAM:
CT ABDOMEN AND PELVIS WITH CONTRAST
TECHNIQUE: Multidetector CT imaging of the abdomen and pelvis was performed
using the standard protocol following bolus administration of
intravenous contrast.
CONTRAST:  100mL OMNIPAQUE IOHEXOL 300 MG/ML  SOLN

[Series 5: a/p w/ cor · coronal · 0.60mm/px · 3 of 132 slices shown]
[im 33/132  soft-tissue]
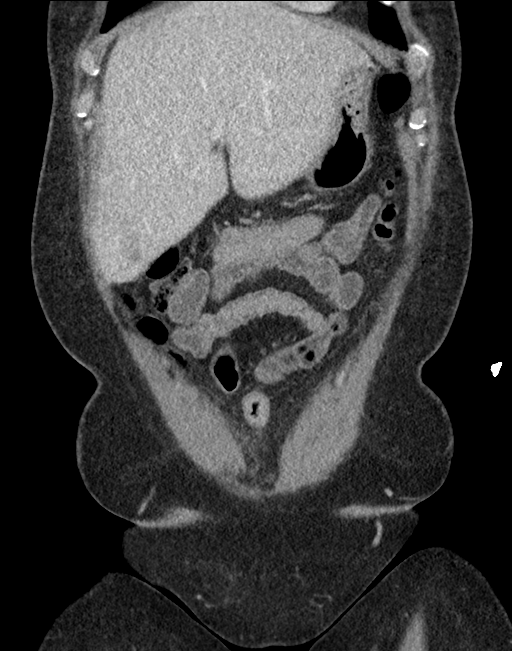
[im 66/132  soft-tissue]
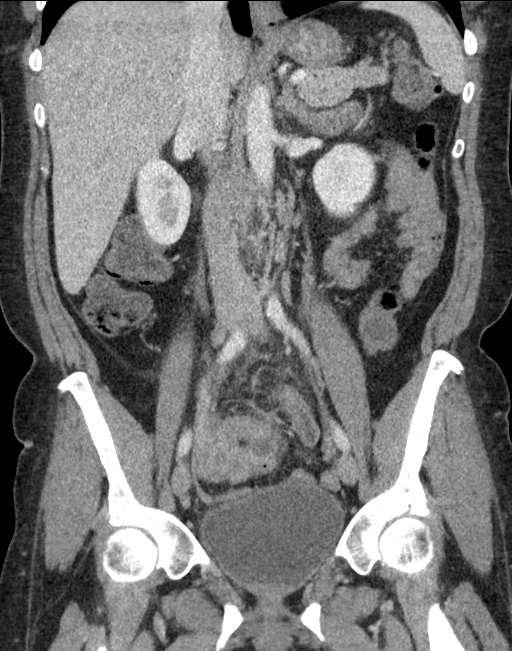
[im 99/132  soft-tissue]
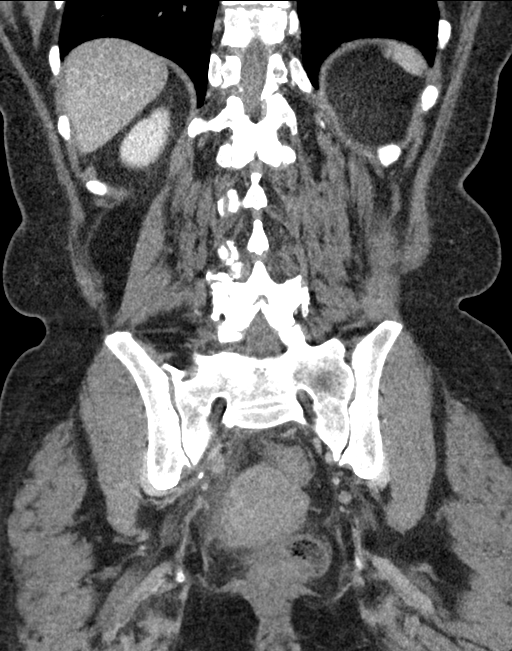

[Series 15: a/p w/ 5mm · axial · 0.75mm/px · z∈[+818,+1162]mm · 12 of 79 slices shown, 14 images]
[im 5/79  soft-tissue]
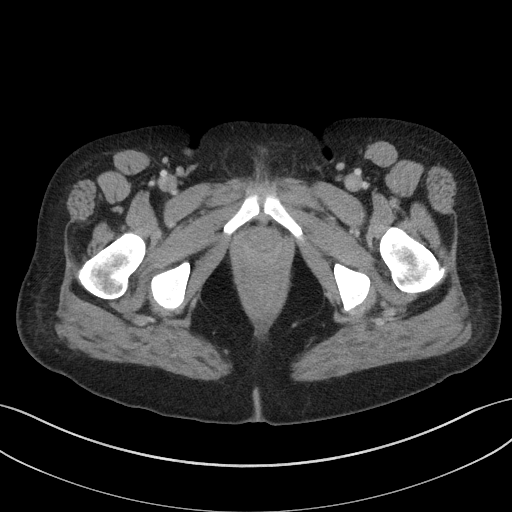
[im 5/79  bone]
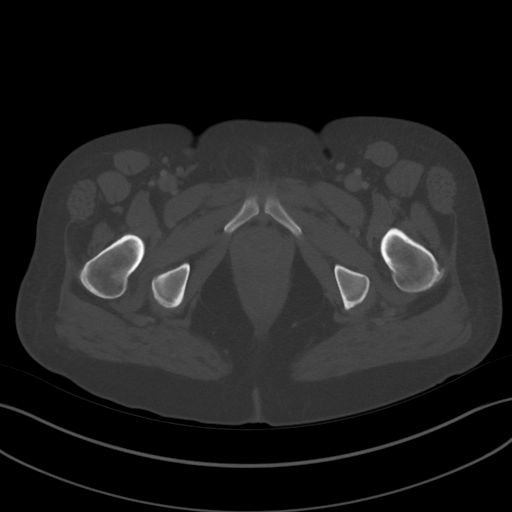
[im 10/79  soft-tissue]
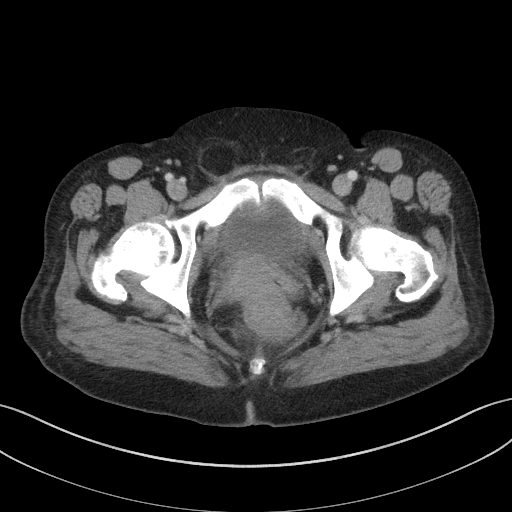
[im 20/79  soft-tissue]
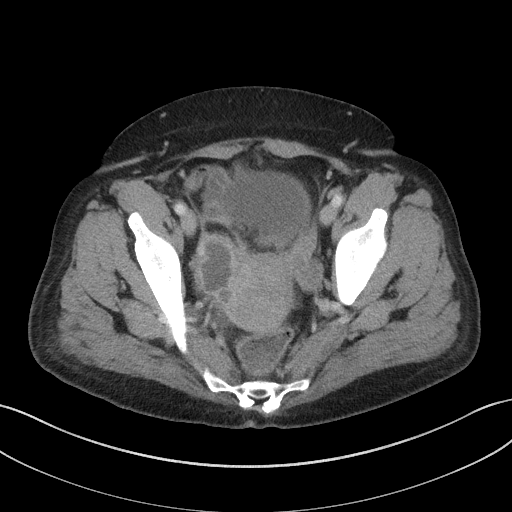
[im 25/79  soft-tissue]
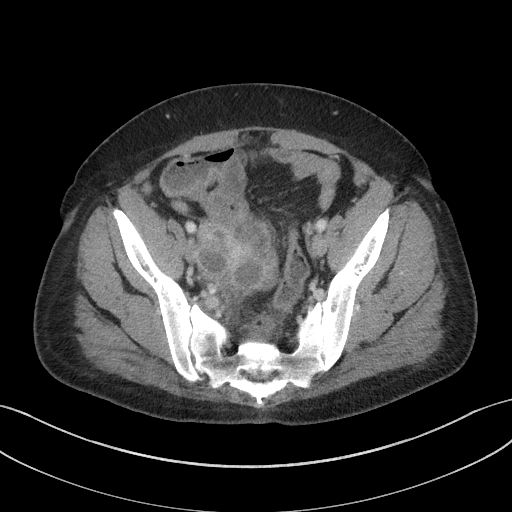
[im 30/79  soft-tissue]
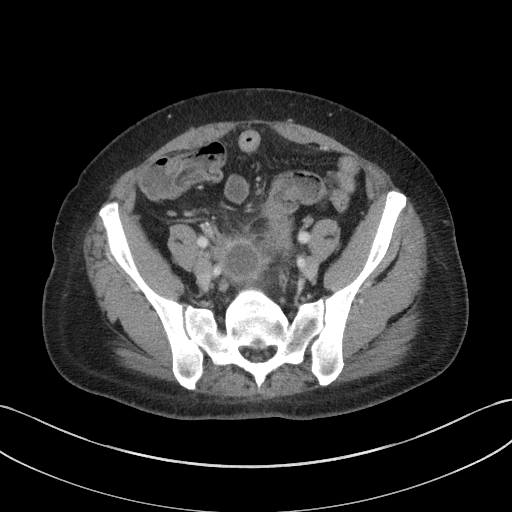
[im 35/79  soft-tissue]
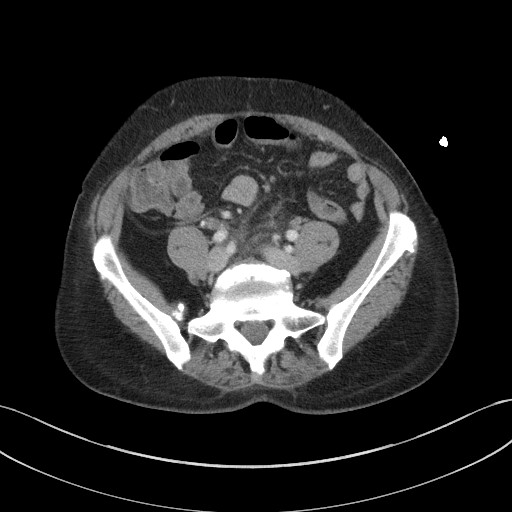
[im 44/79  soft-tissue]
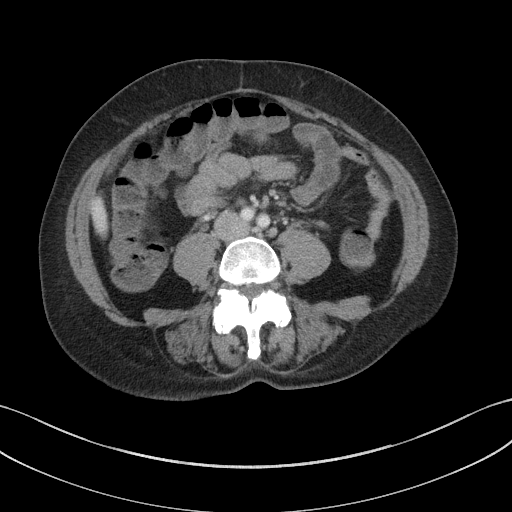
[im 49/79  soft-tissue]
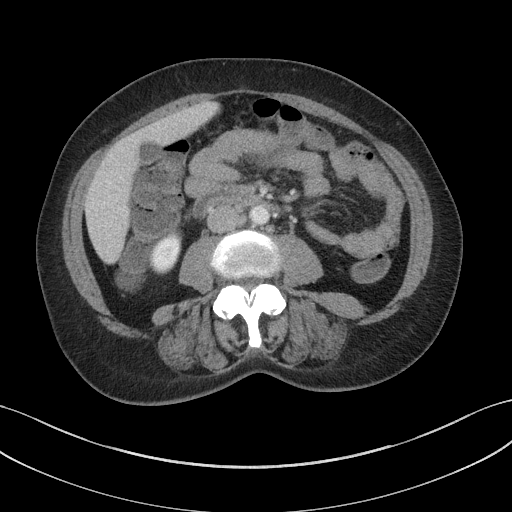
[im 54/79  soft-tissue]
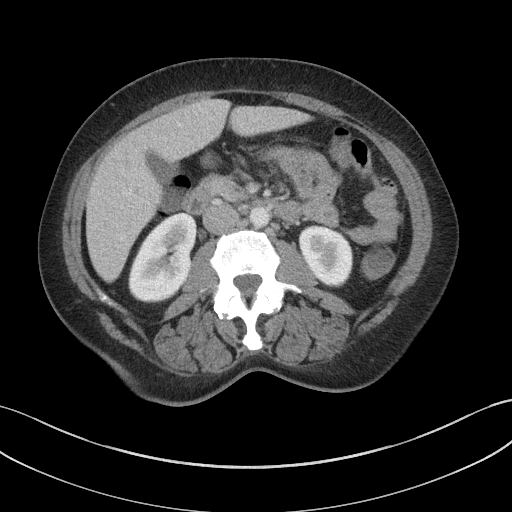
[im 54/79  bone]
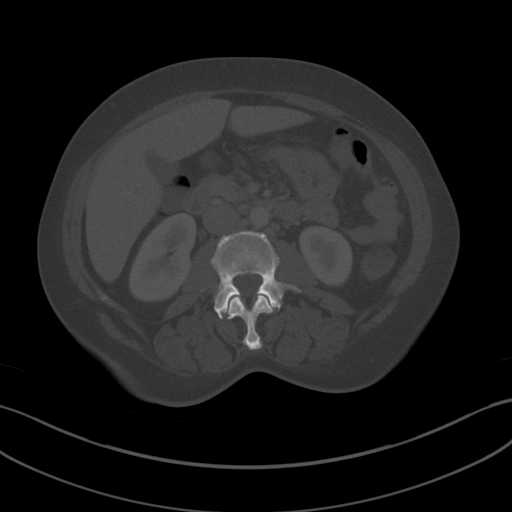
[im 59/79  soft-tissue]
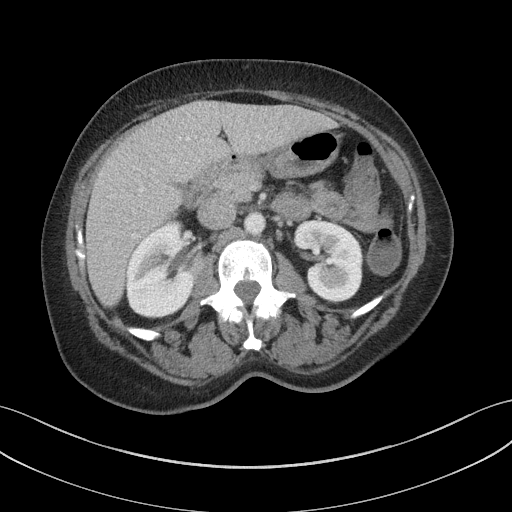
[im 69/79  soft-tissue]
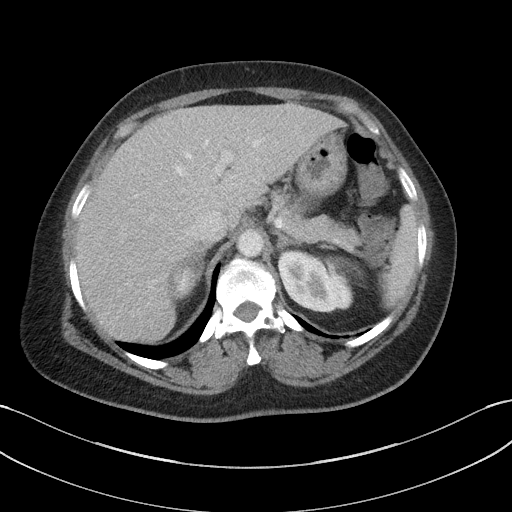
[im 74/79  soft-tissue]
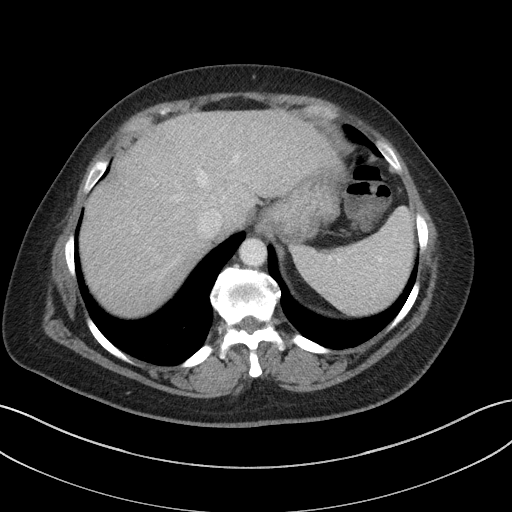

[15 of 46 positions shown; findings below may reference images not displayed]

FINDINGS: Lower chest:  Clear

Hepatobiliary: Normal

Pancreas: Normal

Spleen: Normal

Adrenals/Urinary Tract: Adrenal glands are normal. Kidneys are
normal. Mild dilatation right ureter with caliber transition where
it passes along side the right pelvic mass which will be discussed
in further detail below

Stomach/Bowel: Nonobstructive bowel gas pattern. Stomach small bowel
and large bowel are normal. The appendix arises off of the posterior
inferior tip of the cecum and extends superiorly and medially. The
appendix measures 11 mm in diameter. There is inflammatory change
around the appendix, and its medial tip is obscured by its intimate
association with the complex right pelvic mass immediately posterior
and inferior to the appendix. The mass extends between the appendix
in the right adnexa, and demonstrates both solid and cystic
components. The cystic components show rim enhancement. This mass
measures 51 x 57 mm in transverse, and 75 mm craniocaudal. There is
a small volume of free fluid in the right adnexa, with a small
loculated fluid component in the inferior posterior right adnexa
seen on image number 63, measuring about 14 mm. Inflammatory change
extends cranially from the mass into the retroperitoneum immediately
inferior to the aortic bifurcation

Vascular/Lymphatic: Minimal aortoiliac calcification. Small
periaortic and aortocaval retroperitoneal lymph nodes.

Reproductive: Left ovary is normal. Right ovary is obscured by the
presumably inflammatory mass in the right adnexa. This mass also
obscures the anterior and right borders of the uterus.

Other: Small fat containing right inguinal hernia

Musculoskeletal: No acute osseous abnormalities
IMPRESSION: Complex multiloculated cystic/solid mass right lower quadrant. It is
inseparable from both the appendix and the right adnexa, and is
associated with inflammation as well as mild right hydroureter. Most
likely etiology is appendicitis with associated periappendiceal
abscesses.

## 2017-08-17 DIAGNOSIS — H52223 Regular astigmatism, bilateral: Secondary | ICD-10-CM | POA: Diagnosis not present

## 2017-09-21 DIAGNOSIS — H52223 Regular astigmatism, bilateral: Secondary | ICD-10-CM | POA: Diagnosis not present

## 2017-09-21 DIAGNOSIS — H40009 Preglaucoma, unspecified, unspecified eye: Secondary | ICD-10-CM | POA: Diagnosis not present

## 2017-09-28 DIAGNOSIS — E569 Vitamin deficiency, unspecified: Secondary | ICD-10-CM | POA: Diagnosis not present

## 2017-09-28 DIAGNOSIS — Z01419 Encounter for gynecological examination (general) (routine) without abnormal findings: Secondary | ICD-10-CM | POA: Diagnosis not present

## 2017-09-28 DIAGNOSIS — Z1231 Encounter for screening mammogram for malignant neoplasm of breast: Secondary | ICD-10-CM | POA: Diagnosis not present

## 2017-09-28 DIAGNOSIS — Z6830 Body mass index (BMI) 30.0-30.9, adult: Secondary | ICD-10-CM | POA: Diagnosis not present

## 2017-09-28 DIAGNOSIS — R102 Pelvic and perineal pain: Secondary | ICD-10-CM | POA: Diagnosis not present

## 2017-09-28 DIAGNOSIS — R319 Hematuria, unspecified: Secondary | ICD-10-CM | POA: Diagnosis not present

## 2017-09-28 MED FILL — CEPHALEXIN 500 MG CAPSULE: 500 | 5 days supply | Qty: 10 | Fill #0

## 2017-09-29 MED FILL — VITAMIN D3 50000 UNIT CAPS: 1.25 MG | 56 days supply | Qty: 8 | Fill #0

## 2017-09-30 DIAGNOSIS — R102 Pelvic and perineal pain: Secondary | ICD-10-CM | POA: Diagnosis not present

## 2017-10-14 DIAGNOSIS — R3121 Asymptomatic microscopic hematuria: Secondary | ICD-10-CM | POA: Diagnosis not present

## 2020-09-25 DIAGNOSIS — H52223 Regular astigmatism, bilateral: Secondary | ICD-10-CM | POA: Diagnosis not present

## 2023-07-20 ENCOUNTER — Other Ambulatory Visit: Payer: Self-pay
